# Patient Record
Sex: Female | Born: 1970
Health system: Southern US, Community
[De-identification: ages and names within clinical notes are randomized; demographics above are authoritative.]

## PROBLEM LIST (undated history)

## (undated) DIAGNOSIS — I10 Essential (primary) hypertension: Secondary | ICD-10-CM

## (undated) DIAGNOSIS — R519 Headache, unspecified: Secondary | ICD-10-CM

## (undated) DIAGNOSIS — R51 Headache: Secondary | ICD-10-CM

## (undated) HISTORY — DX: Essential (primary) hypertension: I10

---

## 1998-10-22 ENCOUNTER — Emergency Department (HOSPITAL_COMMUNITY): Admission: EM | Admit: 1998-10-22 | Discharge: 1998-10-22 | Payer: Self-pay | Admitting: Emergency Medicine

## 2001-05-09 ENCOUNTER — Emergency Department (HOSPITAL_COMMUNITY): Admission: EM | Admit: 2001-05-09 | Discharge: 2001-05-09 | Payer: Self-pay

## 2001-05-19 ENCOUNTER — Emergency Department (HOSPITAL_COMMUNITY): Admission: EM | Admit: 2001-05-19 | Discharge: 2001-05-19 | Payer: Self-pay | Admitting: Emergency Medicine

## 2001-07-27 ENCOUNTER — Other Ambulatory Visit: Admission: RE | Admit: 2001-07-27 | Discharge: 2001-07-27 | Payer: Self-pay | Admitting: Gynecology

## 2001-10-06 HISTORY — PX: MYOMECTOMY: SHX85

## 2001-10-06 HISTORY — PX: OVARIAN CYST REMOVAL: SHX89

## 2001-12-27 ENCOUNTER — Inpatient Hospital Stay (HOSPITAL_COMMUNITY): Admission: AD | Admit: 2001-12-27 | Discharge: 2001-12-29 | Payer: Self-pay | Admitting: Gynecology

## 2001-12-27 ENCOUNTER — Encounter (INDEPENDENT_AMBULATORY_CARE_PROVIDER_SITE_OTHER): Payer: Self-pay

## 2001-12-28 ENCOUNTER — Encounter: Payer: Self-pay | Admitting: Gynecology

## 2002-02-07 ENCOUNTER — Other Ambulatory Visit: Admission: RE | Admit: 2002-02-07 | Discharge: 2002-02-07 | Payer: Self-pay | Admitting: Gynecology

## 2002-05-16 ENCOUNTER — Ambulatory Visit (HOSPITAL_COMMUNITY): Admission: RE | Admit: 2002-05-16 | Discharge: 2002-05-16 | Payer: Self-pay | Admitting: Gynecology

## 2002-05-16 ENCOUNTER — Encounter: Payer: Self-pay | Admitting: Gynecology

## 2002-06-07 ENCOUNTER — Inpatient Hospital Stay (HOSPITAL_COMMUNITY): Admission: RE | Admit: 2002-06-07 | Discharge: 2002-06-10 | Payer: Self-pay | Admitting: Gynecology

## 2002-06-07 ENCOUNTER — Encounter (INDEPENDENT_AMBULATORY_CARE_PROVIDER_SITE_OTHER): Payer: Self-pay

## 2004-07-24 ENCOUNTER — Emergency Department (HOSPITAL_COMMUNITY): Admission: EM | Admit: 2004-07-24 | Discharge: 2004-07-24 | Payer: Self-pay | Admitting: Emergency Medicine

## 2004-10-22 ENCOUNTER — Emergency Department (HOSPITAL_COMMUNITY): Admission: EM | Admit: 2004-10-22 | Discharge: 2004-10-22 | Payer: Self-pay | Admitting: Emergency Medicine

## 2008-06-08 ENCOUNTER — Encounter: Admission: RE | Admit: 2008-06-08 | Discharge: 2008-06-08 | Payer: Self-pay | Admitting: Family Medicine

## 2008-06-14 ENCOUNTER — Encounter: Admission: RE | Admit: 2008-06-14 | Discharge: 2008-06-14 | Payer: Self-pay | Admitting: Family Medicine

## 2008-12-15 ENCOUNTER — Encounter: Admission: RE | Admit: 2008-12-15 | Discharge: 2008-12-15 | Payer: Self-pay | Admitting: Family Medicine

## 2009-01-02 ENCOUNTER — Emergency Department (HOSPITAL_COMMUNITY): Admission: EM | Admit: 2009-01-02 | Discharge: 2009-01-02 | Payer: Self-pay | Admitting: Emergency Medicine

## 2009-06-18 ENCOUNTER — Encounter: Admission: RE | Admit: 2009-06-18 | Discharge: 2009-06-18 | Payer: Self-pay | Admitting: Family Medicine

## 2009-06-19 HISTORY — PX: BREAST BIOPSY: SHX20

## 2010-04-06 ENCOUNTER — Ambulatory Visit (HOSPITAL_COMMUNITY): Admission: RE | Admit: 2010-04-06 | Discharge: 2010-04-06 | Payer: Self-pay | Admitting: Family Medicine

## 2011-01-20 ENCOUNTER — Emergency Department (HOSPITAL_COMMUNITY)
Admission: EM | Admit: 2011-01-20 | Discharge: 2011-01-20 | Disposition: A | Discharge status: Home / Self Care | Payer: No Typology Code available for payment source | Attending: Emergency Medicine | Admitting: Emergency Medicine

## 2011-01-20 DIAGNOSIS — I10 Essential (primary) hypertension: Secondary | ICD-10-CM | POA: Insufficient documentation

## 2011-01-20 DIAGNOSIS — Y92410 Unspecified street and highway as the place of occurrence of the external cause: Secondary | ICD-10-CM | POA: Insufficient documentation

## 2011-01-20 DIAGNOSIS — M549 Dorsalgia, unspecified: Secondary | ICD-10-CM | POA: Insufficient documentation

## 2011-01-20 DIAGNOSIS — T1490XA Injury, unspecified, initial encounter: Secondary | ICD-10-CM | POA: Insufficient documentation

## 2011-02-21 NOTE — H&P (Signed)
NAMESHEYENNE, KONZ                        ACCOUNT NO.:  [id number]   MEDICAL RECORD NO.:  [id number]                    PATIENT TYPE:   LOCATION:                                       FACILITY:  [facility]   PHYSICIAN:  Juan H. Lily Peer, M.D.             DATE OF BIRTH:   DATE OF ADMISSION:  06/07/2002  DATE OF DISCHARGE:                                HISTORY & PHYSICAL   CHIEF COMPLAINT:  The patient is a 40 year old gravida 2, para 2, who is  being admitted to [facility] for a scheduled exploratory  laparotomy secondary to a pelvic mass that was diagnosed on November 22nd as  part of her OB screening ultrasound at 20 weeks' gestation last year.  It  appears that the cystic mass was difficult to delineate whether it was  coming off the kidney or the uterus in the left adnexal region.  The patient  was continued to follow during her pregnancy and eventually had a  consultation with Dr. Molli Hazard B. Daphine Deutscher back in December of last year and it  was thought that to wait until after the pregnancy for further intervention  of the mass, if the mass were still present.  In July of this year, she had  a CT of the abdomen due to the fact that her ultrasound postpartum on May  5th had demonstrated that there was a questionable peritoneal inclusion cyst  which was echo-free in appearance; this measured 15.3 x 8.9 x 14.0 cm, but  the right and left ovaries were completely reported to be seen.  Also, she  had several fibroids, the posterior fibroid measuring 5.1 x 4.4 x 4.9 cm.  When she ultimately underwent a CT of the abdomen and pelvis, there was  noted again a large well-circumscribed water-density mass arising out of the  pelvis which measured 9.6 x 14.2 x 17.7 cm in size.  There was a second area  of low density noted in the midline which may lie within the uterus; that  area measured 3 x 4 cm in size, likely consistent with a fibroid.  No free  fluid in the cul-de-sac was noted.   There was no evidence of abdominal or  retroperitoneal mass or adenopathy or aneurysm reported.  The bladder  appeared to be normal.  The working diagnoses of possibility of an ovarian  cyst, endometrioma, cystadenoma, cystadenocarcinoma were also in the  differential diagnoses as well as mesenteric duplication cyst or colonic  duplication cyst.  The patient has had tumor marker consisting of CA125,  alpha-fetoprotein and beta hCG, which all were normal.  A recent Pap smear  also was normal as well.  The patient has otherwise been asymptomatic from  this.  She has had a normal chest x-ray as well.  Consultation with Dr. Rolan Bucco had been made, which he will be on standby for exploration in the  event this  may turn out to be non-gynecologically related, and also the GYN  oncologist, Dr. Rande Brunt. Clarke-Pearson, who had been contacted with this  case last year, will be on standby as well, and all of this had been  discussed with the patient.   PAST MEDICAL HISTORY:  She has a history of chronic hypertension for which  she takes Norvasc 10 mg q.d.   FAMILY HISTORY:  An aunt with diabetes and a father with hypertension and  distant family member with history of breast cancer.   ALLERGIES:  She denies any allergies.   OBSTETRICAL HISTORY:  Her children were delivered vaginally.   PHYSICAL EXAMINATION:  GENERAL:  The patient weight 221 pounds, 5 feet 7  inches.  HEENT:  Unremarkable.  NECK:  Supple.  Trachea midline.  No carotid bruits.  No thyromegaly.  LUNGS:  Lungs are clear to auscultation without rhonchi or wheezes.  HEART:  Regular rate and rhythm.  No murmurs or gallops.  BREASTS:  Exam done during the postpartum period was reported to be normal.  ABDOMEN:  Soft, nontender, without rebound or guarding.  PELVIC:  Fullness in the left adnexal region.  RECTAL:  Exam confirmatory.   ASSESSMENT:  [age]-year-old gravida 2, para 2, with pelvic mass present  since November  22nd, diagnosed at time of her prenatal ultrasound, has  remained stable.  The mass appears to be cystic in nature with no evidence  of calcifications or septations, has remained unchanged since that time, as  confirmed by MRI and CT scan, no evidence of lymphadenopathy on either of  these studies, tumor markers being normal.  Working diagnosis is a  cystadenoma; endometrioma or the possibility of a mesenteric duplication  cyst or colonic duplication cyst and obviously, cystadenocarcinoma are the  working diagnoses.  The patient will be undergoing a bowel prep the day  before surgery.  She will receive prophylactic antibiotic during her surgery  and will have also pneumatic compression stockings to prevent deep venous  thrombosis.  We will proceed with a midline incision.  General surgeon, Dr.  Rolan Bucco, will be on standby in the event this is non-gynecologically  related, and also Dr. Reuel Boom L. Clarke-Pearson, gynecological oncologist,  will be on standby as well in the event this is a malignant process.  The  patient has been counseled for the following:  Exploratory laparotomy, left  and/or right cystectomy and/or salpingo-oophorectomy, possibility of a  staging procedure to include partial omentectomy, para-aortic and pelvic  lymphadenectomy with pelvic washings, with complete total abdominal  hysterectomy and bilateral salpingo-oophorectomy in the event of malignancy.  All of this was discussed with the patient and her mother, who was present  in the preoperative consultation in the office recently.  She is also aware  that in the event of uncontrollable hemorrhage, that she will need a  transfusion and there are the risks of anaphylactic reaction, hepatitis and  acquired immunodeficiency syndrome.  All efforts will be made to proceed  with fertility conservation but in the event of malignancy, she is fully aware of that she may lose all her reproductive organs and will no longer be   able to have any children.  Dr. Rolan Bucco had spoken with the patient, in  the event this is malignancy in her colon, she could potentially end up with  a segmental resection, a diverting colostomy from her surgery as well.  Also, potential risks and trauma to internal organs were also discussed as  well.  All the above issues were discussed with the patient and she is also  aware of the risks for a deep venous thrombosis and pulmonary embolism and  the anesthesia team will be discussing with her the potential risks involved  from general anesthetic.  All the above were discussed with the patient and  her mother, all questions were answered and agreeable to proceed with the  above-mentioned procedures as indicated.   PLAN:  1. As per assessment above.  2. Please have history and physical at [facility] in the operating     room for tomorrow's scheduled case posted for 1 p.m.                                               Juan H. Lily Peer, M.D.    JHF/MEDQ  D:  06/06/2002  T:  06/07/2002  Job:  [id number]

## 2011-02-21 NOTE — Discharge Summary (Signed)
[facility]  Patient:    Kelly Hickman, Kelly Hickman Visit Number: [id number] MRN: [id number]          Service Type: OBS Location: [id number] [id number] Attending Physician:  Merrily Pew Dictated by:   Antony Contras, [facility] Admit Date:  12/27/2001 Discharge Date: 12/29/2001                             Discharge Summary  DISCHARGE DIAGNOSES:          1. Intrauterine pregnancy at 37 weeks.                               2. Pregnancy-induced hypertension.                               3. Pelvic mass.  PROCEDURES:                   Vacuum-assisted vaginal delivery of viable infant over a midline episiotomy second degree laceration.  HISTORY OF PRESENT ILLNESS:   The patient is a 40 year old, gravida 3, para 1-0-0-1, LMP April 08, 2001, [facility] January 14, 2002.  Prenatal course was complicated by late prenatal care and also by a 12 x 14 cm echogenic pelvic mass which was diagnosed at the time of her ultrasound.  LABORATORY DATA:              Blood type A positive.  Antibody screen negative.  Sickle cell positive.  RPR, HBsAg, HIV nonreactive.  HOSPITAL COURSE:              The patient was admitted at 37 weeks with pregnancy-induced hypertension and symptomatic with elevated blood pressure and proteinuria.  Induction of labor was initiated.  Cervix was 3 cm, -3 station, vertex.  The patient did progress to complete dilatation and delivered per vacuum assistance an Apgar 8/9 female infant, birth weight 6 pounds 15 ounces over midline episiotomy with second degree laceration.  Due to blood pressure elevation, the patient was placed on magnesium sulfate during labor.  Postpartum, she remained afebrile.  Blood pressure in the range of 140/80 to 160/93.  She was able to be discharged in satisfactory condition on the second postpartum day.  CBC showed hematocrit 30.3, hemoglobin 9.8, WBC 10.3, platelets 203.  DISPOSITION:                  Followup in two weeks for  blood pressure check; continue with prenatal vitamins and iron with Tylox for pain.  The patient was also scheduled to see a general surgeon, Dr. Wenda Low, in four weeks for abdominal cystic mass.  Ultrasound performed in the hospital after delivery did show the mass was stable in size. Dictated by:   Antony Contras, [facility] Attending Physician:  Merrily Pew DD:  01/17/02 TD:  01/17/02 Job: [id number]

## 2011-02-21 NOTE — Op Note (Signed)
Kelly Hickman, Kelly Hickman                     ACCOUNT NO.:  [id number]   MEDICAL RECORD NO.:  [id number]                   PATIENT TYPE:  INP   LOCATION:  X007                                 FACILITY:  [facility]   PHYSICIAN:  Juan H. Lily Peer, M.D.             DATE OF BIRTH:  1970-11-08   DATE OF PROCEDURE:  06/07/2002  DATE OF DISCHARGE:                                 OPERATIVE REPORT   SURGEON:  Juan H. Lily Peer, M.D.   FIRST ASSISTANT:  Devin M. Ciliberti, M.D.   INDICATION FOR OPERATION:  A 40 year old, gravida 2, para 2, with pelvic  mass as well as leiomyomatous uteri.  The patient with normal tumor markers,  normal Pap smear, normal chest x-ray, and CT and MRI with no evidence of any  pelvic or paraaortic lymphadenopathy.   PREOPERATIVE DIAGNOSES:  1. Pelvic mass.  2. Leiomyomatous uteri.   POSTOPERATIVE DIAGNOSES:  1. Pelvic mass.  2. Leiomyomatous uteri.   ANESTHESIA:  General endotracheal.   PROCEDURES PERFORMED:  1. Exploratory laparotomy.  2. Left salpingo-oophorectomy.  3. Myomectomy.  4. Chromopertubation.   FINDINGS:  1. A 12 x 14 cm, smooth left ovarian cyst.  2. A 4 cm intramural leiomyomata.  3. Normal-appearing right ovary and tube.  4. Left fallopian tube stretched and wrapped around the 12 x 14 cm mass.   DESCRIPTION OF OPERATION:  After the patient was adequately counseled, she  was taken to the operating room where she underwent a successful general  endotracheal anesthesia.  When pneumatic compression stockings were in  place, she received 2 g of Cefotan IV.  After the abdomen was prepped and  draped in the usual sterile fashion, a midline incision was made from the  infraumbilical region 1 cm above the symphysis pubis.  Dissection was  carried down through the skin and subcutaneous tissue down to the rectus  fascia whereby a midline nick was made.  The fascia was incised in a  cephalic and caudal fashion.  The O'Sullivan-O'Connor  retractors were in  place.  Pelvic washings were obtained.  At this time, the mass was evident  to be sitting in the interior portion of the pelvis and abdomen and was  brought out of the operative field without rupturing.  Identification  demonstrated that the mass was ovarian in origin, and the rest of the ovary  looked good, but it was attached to it.  With Kelly clamps, the ovarian cyst  was removed.  The left tube was stretched significantly and distorting the  entire left fallopian tube.  The Kelly clamp was placed close to the ovarian  cortex, and the ovarian cyst was removed in toto and submitted for frozen  specimen.  The pathologist called back a few minutes later, stating that  this frozen specimen was benign and probably a benign serous adenoma.  The  ovarian cortex was reapproximated with a running suture of 3-0 Vicryl suture  as  well as some of the mesosalpinx as well.  Attention was then placed to  the fundal aspect of the uterus which had a 4 cm fibroid.  Pitressin 1:5  solution was infiltrated into the serosal surface of the uterus and  following this, a small vertical incision was made in the fundal region of  the uterus, and the 4 cm leiomyoma was enucleated and passed off the  operative field.  The main chamber was reapproximated with a running stitch  of 3-0 Vicryl suture in layers up through the serosa which was also closed  in a running stitch fashion.  Intercede was then placed for hemostasis.  After completion, after the pelvic cavity had been copiously irrigated with  normal saline solution, the pelvic cavity was evaluated, and hemostasis was  evident.  The contralateral tube and ovary were otherwise normal, and no  other abnormality was noted.  Of note, chromopertubation was attempted which  was evident that extravasation of the methylene blue dye did pass through  the right fallopian tube.  Upon completion of the operation, the pediatric  Foley catheter that had  previously been inserted inside the uterus for the  chromopertubation instillation was removed.  Foley catheter was left in  place.  The patient had pneumatic compression stockings.  She was extubated,  transferred to the recovery room.  She received 30 mg of Toradol en route to  recovery room.  Blood loss from the procedure was 75-100 cc.  Urine output  was 150 cc.  IV fluid was 2100 cc of lactated Ringer's.                                                  Juan H. Lily Peer, M.D.    JHF/MEDQ  D:  06/07/2002  T:  06/07/2002  Job:  [id number]

## 2011-02-21 NOTE — Discharge Summary (Signed)
NAMEDETRIA, CUMMINGS                        ACCOUNT NO.:  [id number]   MEDICAL RECORD NO.:  [id number]                   PATIENT TYPE:   LOCATION:                                       FACILITY:   PHYSICIAN:  Juan H. Lily Peer, M.D.             DATE OF BIRTH:   DATE OF ADMISSION:  06/07/2002  DATE OF DISCHARGE:  06/10/2002                                 DISCHARGE SUMMARY   HISTORY OF PRESENT ILLNESS:  The patient is a 40 year old gravida 2, para 2,  who was admitted to [facility] on June 07, 2002, where she  underwent an exploratory laparotomy with left ovarian cystectomy and  salpingectomy along with myomectomy and chromopertubation.  Findings at the  time of surgery demonstrated a 12 x 14 cm smooth left ovarian cyst and a 4  cm intramural fundal leiomyoma.  The right ovary and tube were otherwise  normal.  Intraoperatively she lost approximately less than 100 cc and  received 2100 cc of lactated Ringer's, and urine output was 150 cc.  Her  postoperative day her t-max had been 100.5 and then defervesced to 100.2 and  gradually down to normal temperature.  She had received two additional doses  of Cefotan besides the one she received preoperatively, 1 g q.12h.  Her  urine output was good.  Her hemoglobin and hematocrit were 10.4 and 31.3  respectively, platelet count 199,000.  Blood pressure was fine.  Her Foley  was discontinued, along with the PCA pump, and she was started on a clear  liquid diet and was started to ambulate and placed on Percocet oral  analgesic.  Later that evening on rounds she still did not pass any flatus  but had good bowel sounds and was tolerating her clear liquid diet well, and  she was afebrile.  The following day she was started on a regular diet, had  good bowel sounds, incision site was intact.  She was discharged home on her  third postoperative day whereby she was passing flatus, tolerating a regular  diet, ambulating, and voiding  well.  Final diagnosis on the pathology report  had demonstrated benign ovarian cystadenoma and fallopian tube segment with  fibrous adhesion and a fibroid submitted separately demonstrating pelvic  necrotic tumor consistent with infarcted leiomyoma.  Her pelvic washings  showed no evidence of malignant cells.   FINAL DISPOSITION AND FOLLOW-UP:  The patient was discharged home on her  third postoperative day, ambulating, voiding well, tolerating a regular diet  well.  She was given a prescription for Lortab 7.5/500 to take 1 p.o. q.4-  6h. p.r.n.  For her blood pressure she was instructed to continue her  previously prescribed Norvasc which consists of 10 mg p.o. q.d.  The patient  was to follow up in the office on June 13, 2002, to have her staples  removed at that point.  Discharge instructions were provided.  Juan H. Lily Peer, M.D.    JHF/MEDQ  D:  07/26/2002  T:  07/26/2002  Job:  [id number]

## 2012-04-01 ENCOUNTER — Other Ambulatory Visit: Payer: Self-pay | Admitting: Physician Assistant

## 2012-07-29 ENCOUNTER — Other Ambulatory Visit: Payer: Self-pay | Admitting: Physician Assistant

## 2012-07-29 NOTE — Telephone Encounter (Signed)
Chart pulled to PA pool at nurses station [phone]

## 2012-07-29 NOTE — Telephone Encounter (Signed)
Please pull paper chart.  

## 2012-07-29 NOTE — Telephone Encounter (Signed)
Please advise patient that she needs an appointment.

## 2012-09-06 ENCOUNTER — Ambulatory Visit (INDEPENDENT_AMBULATORY_CARE_PROVIDER_SITE_OTHER): Payer: BC Managed Care – PPO | Admitting: Internal Medicine

## 2012-09-06 VITALS — BP 130/84 | HR 78 | Temp 98.4°F | Resp 16 | Ht 67.0 in | Wt 255.0 lb

## 2012-09-06 DIAGNOSIS — L509 Urticaria, unspecified: Secondary | ICD-10-CM

## 2012-09-06 DIAGNOSIS — L299 Pruritus, unspecified: Secondary | ICD-10-CM

## 2012-09-06 DIAGNOSIS — K1321 Leukoplakia of oral mucosa, including tongue: Secondary | ICD-10-CM

## 2012-09-06 DIAGNOSIS — K1329 Other disturbances of oral epithelium, including tongue: Secondary | ICD-10-CM

## 2012-09-06 DIAGNOSIS — I10 Essential (primary) hypertension: Secondary | ICD-10-CM

## 2012-09-06 LAB — POCT SKIN KOH: Skin KOH, POC: NEGATIVE

## 2012-09-06 MED ORDER — HYDROCHLOROTHIAZIDE 12.5 MG PO CAPS: 12.5000 mg | ORAL_CAPSULE | Freq: Every day | ORAL | Status: DC

## 2012-09-06 MED ORDER — HYDROXYZINE HCL 25 MG PO TABS: 25.0000 mg | ORAL_TABLET | Freq: Three times a day (TID) | ORAL | Status: DC | PRN

## 2012-09-06 MED ORDER — AMLODIPINE BESYLATE 10 MG PO TABS: 10.0000 mg | ORAL_TABLET | Freq: Every day | ORAL | Status: DC

## 2012-09-06 MED ORDER — METHYLPREDNISOLONE SODIUM SUCC 125 MG IJ SOLR
125.0000 mg | Freq: Once | INTRAMUSCULAR | Status: AC
  Administered 2012-09-06: 125 mg via INTRAMUSCULAR

## 2012-09-06 MED ORDER — CETIRIZINE HCL 10 MG PO TABS: 10.0000 mg | ORAL_TABLET | Freq: Every day | ORAL | Status: DC

## 2012-09-06 MED ORDER — PREDNISONE 10 MG PO TABS: ORAL_TABLET | ORAL | Status: DC

## 2012-09-06 MED ORDER — RANITIDINE HCL 300 MG PO TABS: 300.0000 mg | ORAL_TABLET | Freq: Every day | ORAL | Status: DC

## 2012-09-06 NOTE — Progress Notes (Signed)
[address], [place] [address]   Phone [phone]  Subjective:    Patient ID: Kelly Hickman, female    DOB: 08-21-1971, 41 y.o.   MRN: [id number]  HPI  Pt presents to clinic because her face is swollen and very itchy.  She woke up yesterday and R side of her face was swollen and she tried to eat and had a hard time swallowing because her throat felt swollen.  Since then the swelling has spread to the R side of the face also, but the throat feels less swollen.  She has no pain, or sore throat.  She is terribly itchy and could not sleep last night due to itching.  She has tried no OTC meds.  She has changed nothing in her diet, no supplements, no makeup, soaps or detergent changes.  No new exposures to environment that she knows of.    She also needs refills on her BP meds.  She did not eat great over the holidays.  She has been taking her medications daily.  Her BP at home between the 120-130, unsure of the bottom number.     Review of Systems  Constitutional: Negative for fever and chills.  HENT: Positive for trouble swallowing (due to swelling). Negative for congestion and sore throat.   Respiratory: Negative for shortness of breath and wheezing.   Cardiovascular: Negative for chest pain.  Gastrointestinal: Negative for nausea and abdominal pain.  Skin: Positive for rash.       Objective:   Physical Exam  Vitals reviewed. Constitutional: She is oriented to person, place, and time. She appears well-developed and well-nourished.  HENT:  Head: Normocephalic and atraumatic.  Mouth/Throat: Uvula is midline. No oral lesions. No uvula swelling. No oropharyngeal exudate, posterior oropharyngeal edema, posterior oropharyngeal erythema or tonsillar abscesses.       Hives on auricle and at opening of external canal of ear. Petechia on roof of mouth (pt using tongue depressor for scratching.) - no oral lesions. White coloration on tongue mainly on the L (KOH scraping done)    Cardiovascular: Normal rate, regular rhythm and normal heart sounds.   Pulmonary/Chest: Effort normal and breath sounds normal.  Neurological: She is alert and oriented to person, place, and time.  Skin: Rash noted.       Entire face has hives and dermatographism.  No rash on neck or any other part of her body.    Psychiatric: She has a normal mood and affect. Her behavior is normal. Judgment and thought content normal.   Results for orders placed in visit on 09/06/12  POCT SKIN KOH      Component Value Range   Skin KOH, POC Negative            Assessment & Plan:   1. Hives  methylPREDNISolone sodium succinate (SOLU-MEDROL) 125 mg/2 mL injection 125 mg, predniSONE (DELTASONE) 10 MG tablet  2. HTN (hypertension)  hydrochlorothiazide (MICROZIDE) 12.5 MG capsule, amLODipine (NORVASC) 10 MG tablet  3. Tongue plaque  POCT Skin KOH  4. Itching  cetirizine (ZYRTEC) 10 MG tablet, ranitidine (ZANTAC) 300 MG tablet, hydrOXYzine (ATARAX/VISTARIL) 25 MG tablet   1- unsure of cause but pt is definitely having an allergic reaction to something.  It may be idiopathic in origin.  If it does not improve for returns in the future will send to allergist. 2- needs med refills and will do but we have to see patient back in 1 month to do labs and recheck due to  elevated reading today, but pt is uncomfortable and did not sleep last pm. 3- neg koh 4- related to #1  Pt will recheck if no better in 48h sooner is worse and if any difficulty breathing to ED.  Pt aggress and understands.  D/w and seen with Dr. Merla Riches. I have reviewed and agree with documentation. Robert P. Merla Riches, M.D.

## 2012-11-06 ENCOUNTER — Other Ambulatory Visit: Payer: Self-pay | Admitting: Physician Assistant

## 2014-02-21 ENCOUNTER — Emergency Department (HOSPITAL_COMMUNITY): Payer: BC Managed Care – PPO

## 2014-02-21 ENCOUNTER — Emergency Department (HOSPITAL_COMMUNITY)
Admission: EM | Admit: 2014-02-21 | Discharge: 2014-02-21 | Disposition: A | Discharge status: Home / Self Care | Payer: BC Managed Care – PPO | Attending: Emergency Medicine | Admitting: Emergency Medicine

## 2014-02-21 ENCOUNTER — Encounter (HOSPITAL_COMMUNITY): Payer: Self-pay | Admitting: Emergency Medicine

## 2014-02-21 DIAGNOSIS — I10 Essential (primary) hypertension: Secondary | ICD-10-CM | POA: Insufficient documentation

## 2014-02-21 DIAGNOSIS — R42 Dizziness and giddiness: Secondary | ICD-10-CM | POA: Insufficient documentation

## 2014-02-21 DIAGNOSIS — Z3202 Encounter for pregnancy test, result negative: Secondary | ICD-10-CM | POA: Insufficient documentation

## 2014-02-21 DIAGNOSIS — R0789 Other chest pain: Secondary | ICD-10-CM

## 2014-02-21 DIAGNOSIS — R071 Chest pain on breathing: Secondary | ICD-10-CM | POA: Insufficient documentation

## 2014-02-21 HISTORY — DX: Headache, unspecified: R51.9

## 2014-02-21 HISTORY — DX: Headache: R51

## 2014-02-21 LAB — URINALYSIS, ROUTINE W REFLEX MICROSCOPIC
Bilirubin Urine: NEGATIVE
Glucose, UA: NEGATIVE mg/dL
Ketones, ur: NEGATIVE mg/dL
Leukocytes, UA: NEGATIVE
NITRITE: NEGATIVE
Protein, ur: NEGATIVE mg/dL
Specific Gravity, Urine: 1.024 (ref 1.005–1.030)
UROBILINOGEN UA: 0.2 mg/dL (ref 0.0–1.0)
pH: 5 (ref 5.0–8.0)

## 2014-02-21 LAB — BASIC METABOLIC PANEL
BUN: 12 mg/dL (ref 6–23)
CALCIUM: 9.1 mg/dL (ref 8.4–10.5)
CO2: 25 mEq/L (ref 19–32)
Chloride: 104 mEq/L (ref 96–112)
Creatinine, Ser: 0.9 mg/dL (ref 0.50–1.10)
GFR calc Af Amer: 89 mL/min — ABNORMAL LOW (ref >90)
GFR calc non Af Amer: 77 mL/min — ABNORMAL LOW (ref >90)
GLUCOSE: 88 mg/dL (ref 70–99)
Potassium: 4.5 mEq/L (ref 3.7–5.3)
SODIUM: 140 meq/L (ref 137–147)

## 2014-02-21 LAB — I-STAT TROPONIN, ED: Troponin i, poc: 0 ng/mL (ref 0.00–0.08)

## 2014-02-21 LAB — PREGNANCY, URINE: PREG TEST UR: NEGATIVE

## 2014-02-21 LAB — CBC
HCT: 32 % — ABNORMAL LOW (ref 36.0–46.0)
Hemoglobin: 9.5 g/dL — ABNORMAL LOW (ref 12.0–15.0)
MCH: 19.3 pg — AB (ref 26.0–34.0)
MCHC: 29.7 g/dL — AB (ref 30.0–36.0)
MCV: 65 fL — ABNORMAL LOW (ref 78.0–100.0)
PLATELETS: 313 10*3/uL (ref 150–400)
RBC: 4.92 MIL/uL (ref 3.87–5.11)
RDW: 18.4 % — AB (ref 11.5–15.5)
WBC: 5.9 10*3/uL (ref 4.0–10.5)

## 2014-02-21 LAB — URINE MICROSCOPIC-ADD ON

## 2014-02-21 MED ORDER — ACETAMINOPHEN 325 MG PO TABS
650.0000 mg | ORAL_TABLET | Freq: Once | ORAL | Status: AC
  Administered 2014-02-21: 650 mg via ORAL
  Filled 2014-02-21: qty 2

## 2014-02-21 MED ORDER — IBUPROFEN 400 MG PO TABS
400.0000 mg | ORAL_TABLET | Freq: Once | ORAL | Status: AC
  Administered 2014-02-21: 400 mg via ORAL
  Filled 2014-02-21: qty 1

## 2014-02-21 NOTE — ED Notes (Signed)
She states she didn't feel well when she woke up this morning. After she got to work she began to have CP and felt lightheaded. She went to see the nurse and her BP was 180/110 so the nurse told her to come to the ED. She states she had a brief episode of R arm numbness this morning also, but that has resolved. Her speech is slow but clear, her son states "she always talks like that." she is alert and breathing easily

## 2014-02-21 NOTE — ED Provider Notes (Signed)
CSN: [id number]     Arrival date & time 02/21/14  1056 History   First MD Initiated Contact with Patient 02/21/14 1159     Chief Complaint  Patient presents with  . Chest Pain    HPI Pt was seen at 1220. Per pt, c/o gradual onset and persistence of constant chest "pain" since waking up at 0300. States she has been "under a lot of stress" and "just not feeling right" when she woke up at 0300. Cannot be more specific. Pt states while she was at work she "got into a situation" with her other co-workers and "got more upset." States she "jumped up quick" from sit to standing and "felt lightheaded" and "like everything was spinning." Pt states she sat down again with improvement. At one point pt also experienced "numbness" in the dorsal aspect of her right forearm that lasted only a few seconds and has not returned; denies focal weakness. Pt states she "was still upset about the situation at work" so she went to the nurse and had her BP checked. The nurse told her that her BP was "180/100" and she was sent home. Pt states her family "made me come to the ER" because she was still having chest discomfort. Family states pt is otherwise acting per her baseline. Denies palpitations, no SOB/cough, no abd pain, N/V/D, no back pain. Per pt, c/o gradual onset and persistence of constant acute flair of her chronic migraine headache "since all this happened today." Describes the headache as "dull" and "aching," per her usual chronic headache pain pattern for several years.  Denies headache was sudden or maximal in onset or at any time.  Denies visual changes, no slurred speech, no focal motor weakness, no fevers, no neck pain, no rash.      Past Medical History  Diagnosis Date  . Hypertension   . Headache    Past Surgical History  Procedure Laterality Date  . Ovarian cyst removal Left 2003  . Myomectomy  2003   Family History  Problem Relation Age of Onset  . Adopted: Yes   History  Substance Use Topics   . Smoking status: Never Smoker   . Smokeless tobacco: Not on file  . Alcohol Use: No    Review of Systems ROS: Statement: All systems negative except as marked or noted in the HPI; Constitutional: Negative for fever and chills. ; ; Eyes: Negative for eye pain, redness and discharge. ; ; ENMT: Negative for ear pain, hoarseness, nasal congestion, sinus pressure and sore throat. ; ; Cardiovascular: +CP. Negative for palpitations, diaphoresis, dyspnea and peripheral edema. ; ; Respiratory: Negative for cough, wheezing and stridor. ; ; Gastrointestinal: Negative for nausea, vomiting, diarrhea, abdominal pain, blood in stool, hematemesis, jaundice and rectal bleeding. . ; ; Genitourinary: Negative for dysuria, flank pain and hematuria. ; ; Musculoskeletal: Negative for back pain and neck pain. Negative for swelling and trauma.; ; Skin: Negative for pruritus, rash, abrasions, blisters, bruising and skin lesion.; ; Neuro: +"spinning," paresthesia. Negative for headache, lightheadedness and neck stiffness. Negative for weakness, altered level of consciousness , altered mental status, extremity weakness, involuntary movement, seizure and syncope.; Psych:  +"stressed out."       Allergies  Review of patient's allergies indicates no known allergies.  Home Medications   Prior to Admission medications   Medication Sig Start Date End Date Taking? Authorizing Provider  ASPIRIN PO Take 1 tablet by mouth daily as needed (pain).   Yes Historical Provider, MD   BP  162/86  Pulse 60  Temp(Src) 98.2 F (36.8 C) (Oral)  Resp 21  Ht 5\' 7"  (1.702 m)  Wt 239 lb (108.41 kg)  BMI 37.42 kg/m2  SpO2 99% Physical Exam 1225: Physical examination:  Nursing notes reviewed; Vital signs and O2 SAT reviewed;  Constitutional: Well developed, Well nourished, Well hydrated, In no acute distress; Head:  Normocephalic, atraumatic; Eyes: EOMI, PERRL, No scleral icterus; ENMT: Mouth and pharynx normal, Mucous membranes moist;  Neck: Supple, Full range of motion, No lymphadenopathy; Cardiovascular: Regular rate and rhythm, No murmur, rub, or gallop; Respiratory: Breath sounds clear & equal bilaterally, No rales, rhonchi, wheezes.  Speaking full sentences with ease, Normal respiratory effort/excursion; Chest: Nontender, Movement normal; Abdomen: Soft, Nontender, Nondistended, Normal bowel sounds; Genitourinary: No CVA tenderness; Spine:  No midline CS, TS, LS tenderness. +TTP left > right hypertonic trapezius muscles.;; Extremities: Pulses normal, No tenderness, No edema, No calf edema or asymmetry.; Neuro: AA&Ox3, Major CN grossly intact. Speech clear.  No facial droop.  No nystagmus. Grips equal. Strength 5/5 equal bilat UE's and LE's.  DTR 2/4 equal bilat UE's and LE's.  No gross sensory deficits.  Normal cerebellar testing bilat UE's (finger-nose) and LE's (heel-shin). Climbs on and off stretcher easily by herself. Gait steady.; Skin: Color normal, Warm, Dry.    ED Course  Procedures     EKG Interpretation   Date/Time:  Tuesday Feb 21 2014 10:59:10 EDT Ventricular Rate:  92 PR Interval:  124 QRS Duration: 86 QT Interval:  368 QTC Calculation: 455 R Axis:   18 Text Interpretation:  Normal sinus rhythm Normal ECG No old tracing to  compare Confirmed by Juleen ChinaKOHUT  MD, STEPHEN ([id number]) on 02/21/2014 11:04:17 AM      MDM  MDM Reviewed: previous chart, nursing note and vitals Interpretation: labs, ECG and x-ray   Results for orders placed during the hospital encounter of 02/21/14  CBC      Result Value Ref Range   WBC 5.9  4.0 - 10.5 K/uL   RBC 4.92  3.87 - 5.11 MIL/uL   Hemoglobin 9.5 (*) 12.0 - 15.0 g/dL   HCT 16.132.0 (*) 09.636.0 - 04.546.0 %   MCV 65.0 (*) 78.0 - 100.0 fL   MCH 19.3 (*) 26.0 - 34.0 pg   MCHC 29.7 (*) 30.0 - 36.0 g/dL   RDW 40.918.4 (*) 81.111.5 - 91.415.5 %   Platelets 313  150 - 400 K/uL  BASIC METABOLIC PANEL      Result Value Ref Range   Sodium 140  137 - 147 mEq/L   Potassium 4.5  3.7 - 5.3 mEq/L    Chloride 104  96 - 112 mEq/L   CO2 25  19 - 32 mEq/L   Glucose, Bld 88  70 - 99 mg/dL   BUN 12  6 - 23 mg/dL   Creatinine, Ser 7.820.90  0.50 - 1.10 mg/dL   Calcium 9.1  8.4 - 95.610.5 mg/dL   GFR calc non Af Amer 77 (*) >90 mL/min   GFR calc Af Amer 89 (*) >90 mL/min  URINALYSIS, ROUTINE W REFLEX MICROSCOPIC      Result Value Ref Range   Color, Urine YELLOW  YELLOW   APPearance HAZY (*) CLEAR   Specific Gravity, Urine 1.024  1.005 - 1.030   pH 5.0  5.0 - 8.0   Glucose, UA NEGATIVE  NEGATIVE mg/dL   Hgb urine dipstick LARGE (*) NEGATIVE   Bilirubin Urine NEGATIVE  NEGATIVE   Ketones, ur NEGATIVE  NEGATIVE mg/dL   Protein, ur NEGATIVE  NEGATIVE mg/dL   Urobilinogen, UA 0.2  0.0 - 1.0 mg/dL   Nitrite NEGATIVE  NEGATIVE   Leukocytes, UA NEGATIVE  NEGATIVE  PREGNANCY, URINE      Result Value Ref Range   Preg Test, Ur NEGATIVE  NEGATIVE  URINE MICROSCOPIC-ADD ON      Result Value Ref Range   Squamous Epithelial / LPF FEW (*) RARE   WBC, UA 0-2  <3 WBC/hpf   RBC / HPF 7-10  <3 RBC/hpf   Bacteria, UA FEW (*) RARE   Urine-Other MUCOUS PRESENT    I-STAT TROPOININ, ED      Result Value Ref Range   Troponin i, poc 0.00  0.00 - 0.08 ng/mL   Comment 3            Dg Chest 2 View 02/21/2014   CLINICAL DATA:  Chest pain and hypertension.  EXAM: CHEST  2 VIEW  COMPARISON:  None.  FINDINGS: The cardiomediastinal silhouette is within normal limits. The lungs are slightly hypoinflated and clear. No pleural effusion or pneumothorax is identified. No acute osseous abnormality is identified.  IMPRESSION: No active cardiopulmonary disease.   Electronically Signed   By: Sebastian AcheAllen  Grady   On: 02/21/2014 12:43    1425:  Pt states she feels better after meds and wants to go home now. Pt is not orthostatic. Neuro exam remains intact and unchanged; doubt CVA. Doubt PE as cause for symptoms with low risk [place].  Doubt ACS as cause for symptoms with normal troponin and EKG after 8+ hours of constant symptoms. Dx and  testing d/w pt.  Questions answered.  Verb understanding, agreeable to d/c home with outpt f/u.       Laray AngerKathleen M Rahiem Schellinger, DO 02/25/14 0001

## 2014-02-21 NOTE — Discharge Instructions (Signed)
°Emergency Department Resource Guide °1) Find a Doctor and Pay Out of Pocket °Although you won't have to find out who is covered by your insurance plan, it is a good idea to ask around and get recommendations. You will then need to call the office and see if the doctor you have chosen will accept you as a new patient and what types of options they offer for patients who are self-pay. Some doctors offer discounts or will set up payment plans for their patients who do not have insurance, but you will need to ask so you aren't surprised when you get to your appointment. ° °2) Contact Your Local Health Department °Not all health departments have doctors that can see patients for sick visits, but many do, so it is worth a call to see if yours does. If you don't know where your local health department is, you can check in your phone book. The CDC also has a tool to help you locate your state's health department, and many state websites also have listings of all of their local health departments. ° °3) Find a Walk-in Clinic °If your illness is not likely to be very severe or complicated, you may want to try a walk in clinic. These are popping up all over the country in pharmacies, drugstores, and shopping centers. They're usually staffed by nurse practitioners or physician assistants that have been trained to treat common illnesses and complaints. They're usually fairly quick and inexpensive. However, if you have serious medical issues or chronic medical problems, these are probably not your best option. ° °No Primary Care Doctor: °- Call [facility] at  832-8000 - they can help you locate a primary care doctor that  accepts your insurance, provides certain services, etc. °- Physician Referral Service- 1-800-533-3463 ° °Chronic Pain Problems: °Organization         Address  Phone   Notes  °[facility]  ([phone]) [phone] Patients need to be referred by their primary care doctor.  ° °Medication  Assistance: °Organization         Address  Phone   Notes  °[place] [facility] [address] °[place], [place] [address] (336) 641-8030 --Must be a resident of [place] °-- Must have NO insurance coverage whatsoever (no Medicaid/ Medicare, etc.) °-- The pt. MUST have a primary care doctor that directs their care regularly and follows them in the community °  °[facility]  (866) 331-1348   °[employer]  (888) 892-1162   ° °Agencies that provide inexpensive medical care: °Organization         Address  Phone   Notes  °[facility]  (336) 832-8035   °[facility]    (336) 832-7272   °[facility] [address] °[place], [place] [address] (336) 832-4777   °[facility] [address], °[place] (336) 271-4999   °[facility]    (336) 373-0678   °[facility]    (336) 272-1050   °[facility] ° [address], [place] Phone:  (336) 832-4444, Fax:  (336) 832-4440 Hours of Operation:  9 am - 6 pm, M-F.  Also accepts Medicaid/Medicare and self-pay.  °[facility] ° [address], [place] Phone: (336) [phone], Fax: (336) [phone]. Hours of Operation:  8:30 am - 5:30 pm, M-F.  Also accepts Medicaid and self-pay.  °[facility] [place] [address], [place] Phone: (336) 878-6027   °[facility] [address], [place], [place] (336)723-1848, Ext. 123 Mondays & Thursdays: 7-9 AM.  First 15 patients are seen on a first come, first serve basis. °  ° °Medicaid-accepting [place] Providers: ° °Organization         Address  Phone   Notes  °[facility] [address], [place] (336) 641-2100 Also accepts self-pay patients.  °[facility] [address], [place] ° (336) 856-9996   °[facility] [address], [place]  (336) 288-8857   °[facility] [address] [place] [address], [place] (336) 299-7000   °Veita Bland [address], [place]  ° (336) 373-1557 Only accepts [place] Access Medicaid patients after they have their name applied to their card.  ° °Self-Pay (no insurance) in [place]: ° °Organization         Address  Phone   Notes  °Sickle Cell Patients, [facility] [address], [place] (336) 832-1970   °[facility] [address], [place] (336) 832-4400   °[facility] ° [address], [place] (336) 992-4800   °[facility]/Dr. Osei-Bonsu ° [address] [place] [address], [place] or [address], [place] (336) [phone] Phone number for both [place] and [place] locations is the same.  °[facility] [address], [place] (336) 299-0000   °[facility] [address] [place] [address], [place] or [address] (336) 852-7530 °(336) 878-2260   °[facility] [address], [place] (336) 350-1642, phone; (336) 294-5005, fax Sees patients 1st and 3rd Saturday of every month.  Must not qualify for public or private insurance (i.e. Medicaid, Medicare, [place] Health Choice, Veterans' Benefits) • Household income should be no more than 200% of the poverty level •The clinic cannot treat you if you are pregnant or think you are pregnant • Sexually transmitted diseases are not treated at the clinic.  ° ° °Dental Care: °Organization         Address  Phone  Notes  °[place] [facility] [address], [place] (336) 641-6152 Accepts children up to age 21 who are enrolled in Medicaid or [place] Health Choice; pregnant women with a Medicaid card; and children who have applied for Medicaid or [place] Health Choice, but were declined, whose parents can pay a reduced fee at time of service.  °[place]  [facility] [place]  [address], [place] (336) 641-7733 Accepts children up to age 21 who are enrolled in Medicaid or [place] Health Choice; pregnant women with a Medicaid card; and children who have applied for Medicaid or [place] Health Choice, but were declined, whose parents can pay a reduced fee at time of service.  °[facility] ° [address], [place] (336) 641-4533 Patients are seen by appointment only. Walk-ins are not accepted. [facility] will see patients 18 years of age and older. °Monday - Tuesday (8am-5pm) °Most Wednesdays (8:30-5pm) °$30 per visit, cash only  °[facility] ° [address], [place] (336) 641-4533 Patients are seen by appointment only. Walk-ins are not accepted. [facility] will see patients 18 years of age and older. °One   Wednesday Evening (Monthly: Volunteer Based).  $30 per visit, cash only  °[facility]  (919) [phone] for adults; Children under age 4, call Graduate Pediatric Dentistry at ([phone]) [phone]. Children aged 4-14, please call ([phone]) [phone] to request a pediatric application. ° Dental services are provided in all areas of dental care including fillings, crowns and bridges, complete and partial dentures, implants, gum treatment, root canals, and extractions. Preventive care is also provided. Treatment is provided to both adults and children. °Patients are selected via a lottery and there is often a waiting list. °  °[facility] [address], °[place] ° (336) 763-8833 [email or web address] °  °[facility] [address], [place], [place] (336)723-1848, Ext. 123 Second and Fourth Thursday of each month, opens at 6:30 AM; Clinic ends at 9 AM.  Patients are seen on a first-come first-served basis, and a limited number are seen during each clinic.  ° °[facility] ° [address], [place], [place] (336) 723-7904    Eligibility Requirements °You must have lived in [place], [place], or [place] counties for at least the last three months. °  You cannot be eligible for state or federal sponsored healthcare insurance, including Veterans Administration, Medicaid, or Medicare. °  You generally cannot be eligible for healthcare insurance through your employer.  °  How to apply: °Eligibility screenings are held every Tuesday and Wednesday afternoon from 1:00 pm until 4:00 pm. You do not need an appointment for the interview!  °[facility] [address], [place], [place] 336-631-2330   °[facility]  336-342-8273   °[facility]  336-703-3100   °[facility]  336-570-6415   ° °Behavioral Health Resources in the Community: °Intensive Outpatient Programs °Organization         Address  Phone  Notes  °[facility] [address], [place], [place] 336-878-6098   °[facility] [address], [place], [place] 336-832-9800   °[facility] [address], [place], [place] ° 336-882-2125   °[facility] [address],  °[place], [place] 1-800-853-5163 or 336-641-4981   °Substance Abuse Resources °Organization         Address  Phone  Notes  °[facility]  336-882-2125   °[facility]  336-784-9470   °[facility]  336-285-9073   °[facility]  336-845-3988   °Residential & Outpatient Substance Abuse Program  1-800-659-3381   °Psychological Services °Organization         Address  Phone  Notes  °[facility]  336- 832-9600   °[facility]  336- 378-7881   °[facility] [address], [place] 1-800-853-5163 or 336-641-4981   ° °Mobile Crisis Teams °Organization         Address  Phone  Notes  °[facility], Mobile Crisis Care Unit  1-877-626-1772   °Assertive °[facility] ° [address]  [place], [place] 336-834-9664   °Sharon DeEsch [address] °[place] 336-554-5454   ° °Self-Help/Support Groups °Organization         Address  Phone             Notes  °[facility] of  - variety of support groups  336- 373-1402 Call for more information  °Narcotics Anonymous (NA), [facility] [address], °[place]  2 meetings at this location  ° °  Residential Treatment Programs Organization         Address  Phone  Notes  [facility] [address],    [place]  [phone]   [facility]  [address], [place] [phone], [place], [place] [phone]   [facility] [address], [place] [phone] Admissions: 8am-3pm M-F  [facility] [address]    [place], [place] 295-284-1324([phone]   [facility] [address], [place], [place] [phone]) [phone]   [facility] [address]  [place], [place] [phone]) [phone]   [facility] Intensive Outpatient [address], [place], [place] [phone]   [facility] ([facility] [address]  [place], [place] [phone] or [phone]   [facility] [address] [place], [place] [phone] Accepts Medicaid  [facility] [address]  [place] [phone] Substance Abuse/Addiction Treatment   [facility]         Address  Phone  Notes  [facility]  (323)[phone](888) (206) [phone]   Angie FavaJulie Brannon, PhD [address], [address] [place], [place]   [phone](336) [phone] or [phone]) [phone]   [facility]   [address] [place], [place] [phone](336) ([phone]   [facility] [address], [place], [place] [phone]) [phone] Insurance/Medicaid/sponsorship through [facility] [address]                                    [place], [place] [phone]) [phone] Therapy/tele-psych/case    [facility] [address]   [place], [place] [phone]) [phone]    Dr. Lolly MustacheArfeen  [phone]) [phone]   [facility]  [employer] [facility] 1) [address], [place] 2) [address], [place] 3)  [address], [place] [phone](336) [phone](336) [phone]  [phone](336) ([phone]   [facility] Child Abuse Hotline 3206941133(336) 279-240-2275 or (820)[phone](336) [phone] (After Hours)       Take your usual prescriptions as previously directed.  Take your blood pressure only ONCE per day, either in the morning after you take your medicine(s) or in the evening before you go to bed.  Always sit quietly for at least 15 minutes before taking your blood pressure.  Keep a diary of your blood pressures to show your doctor at your follow up office visit. Take over the counter tylenol and ibuprofen, as directed on packaging, as needed for discomfort. Call your regular medical doctor today to schedule a follow up appointment within the next 2 days.  Return to the Emergency Department immediately sooner if worsening.

## 2019-04-01 ENCOUNTER — Other Ambulatory Visit: Payer: Self-pay | Admitting: Internal Medicine

## 2019-04-07 LAB — NOVEL CORONAVIRUS, NAA: SARS-CoV-2, NAA: NOT DETECTED

## 2019-10-21 ENCOUNTER — Encounter: Payer: Self-pay | Admitting: Registered Nurse

## 2019-10-21 ENCOUNTER — Ambulatory Visit: Payer: BC Managed Care – PPO | Admitting: Registered Nurse

## 2019-10-21 ENCOUNTER — Other Ambulatory Visit: Payer: Self-pay

## 2019-10-21 VITALS — BP 186/106 | HR 88 | Temp 98.3°F | Ht 67.0 in | Wt 249.2 lb

## 2019-10-21 DIAGNOSIS — Z13220 Encounter for screening for lipoid disorders: Secondary | ICD-10-CM

## 2019-10-21 DIAGNOSIS — Z1329 Encounter for screening for other suspected endocrine disorder: Secondary | ICD-10-CM | POA: Diagnosis not present

## 2019-10-21 DIAGNOSIS — Z23 Encounter for immunization: Secondary | ICD-10-CM

## 2019-10-21 DIAGNOSIS — I10 Essential (primary) hypertension: Secondary | ICD-10-CM

## 2019-10-21 DIAGNOSIS — Z130 Encounter for screening for diseases of the blood and blood-forming organs and certain disorders involving the immune mechanism: Secondary | ICD-10-CM

## 2019-10-21 DIAGNOSIS — Z13228 Encounter for screening for other metabolic disorders: Secondary | ICD-10-CM | POA: Diagnosis not present

## 2019-10-21 MED ORDER — AMLODIPINE BESYLATE 5 MG PO TABS: 5.0000 mg | ORAL_TABLET | Freq: Every day | ORAL | 0 refills | Status: DC

## 2019-10-21 NOTE — Patient Instructions (Addendum)
Ms Kelly Hickman to meet you today.   Your blood pressure is elevated, but we can help. Please take Norvasc (amlodipine) 5mg  by mouth each morning. After two weeks, increase this to 10mg  daily.  I'm going to have you stop by for a nurse only visit in 2 weeks for a blood pressure check. It would be great to have you bring your home blood pressure cuff in with you so we can compare the readings to ours here in the office. I'll have you come in for a visit with me about 2 weeks after that, in mid February. At that time. We'll discuss how your blood pressure looks and take further steps if needed  Today we drew some labs - CBC (blood counts) CMP (liver and kidney function, some electrolytes and minerals) TSH (thyroid function) A1c (sugars) Lipid panel (cholesterol). I'd expect these to be back next week at some point.  Please feel free to reach out with any questions, comments, or concerns. I'd be happy to address any of these for you.  , NP  If you have lab work done today you will be contacted with your lab results within the next 2 weeks.  If you have not heard from March then please contact [facility]. The fastest way to get your results is to register for My Chart.   IF you received an x-ray today, you will receive an invoice from [facility]. Please contact [facility] at [phone] with questions or concerns regarding your invoice.   IF you received labwork today, you will receive an invoice from [place]. Please contact [facility] at [phone] with questions or concerns regarding your invoice.   Our billing staff will not be able to assist you with questions regarding bills from these companies.  You will be contacted with the lab results as soon as they are available. The fastest way to get your results is to activate your My Chart account. Instructions are located on the last page of this paperwork. If you have not heard from [facility] regarding the results in 2  weeks, please contact this office.       Hypertension, Adult High blood pressure (hypertension) is when the force of blood pumping through the arteries is too strong. The arteries are the blood vessels that carry blood from the heart throughout the body. Hypertension forces the heart to work harder to pump blood and may cause arteries to become narrow or stiff. Untreated or uncontrolled hypertension can cause a heart attack, heart failure, a stroke, kidney disease, and other problems. A blood pressure reading consists of a higher number over a lower number. Ideally, your blood pressure should be below 120/80. The first ("top") number is called the systolic pressure. It is a measure of the pressure in your arteries as your heart beats. The second ("bottom") number is called the diastolic pressure. It is a measure of the pressure in your arteries as the heart relaxes. What are the causes? The exact cause of this condition is not known. There are some conditions that result in or are related to high blood pressure. What increases the risk? Some risk factors for high blood pressure are under your control. The following factors may make you more likely to develop this condition:  Smoking.  Having type 2 diabetes mellitus, high cholesterol, or both.  Not getting enough exercise or physical activity.  Being overweight.  Having too much fat, sugar, calories, or salt (sodium) in your diet.  Drinking  too much alcohol. Some risk factors for high blood pressure may be difficult or impossible to change. Some of these factors include:  Having chronic kidney disease.  Having a family history of high blood pressure.  Age. Risk increases with age.  Race. You may be at higher risk if you are African American.  Gender. Men are at higher risk than women before age 46. After age 73, women are at higher risk than men.  Having obstructive sleep apnea.  Stress. What are the signs or symptoms? High  blood pressure may not cause symptoms. Very high blood pressure (hypertensive crisis) may cause:  Headache.  Anxiety.  Shortness of breath.  Nosebleed.  Nausea and vomiting.  Vision changes.  Severe chest pain.  Seizures. How is this diagnosed? This condition is diagnosed by measuring your blood pressure while you are seated, with your arm resting on a flat surface, your legs uncrossed, and your feet flat on the floor. The cuff of the blood pressure monitor will be placed directly against the skin of your upper arm at the level of your heart. It should be measured at least twice using the same arm. Certain conditions can cause a difference in blood pressure between your right and left arms. Certain factors can cause blood pressure readings to be lower or higher than normal for a short period of time:  When your blood pressure is higher when you are in a health care provider's office than when you are at home, this is called white coat hypertension. Most people with this condition do not need medicines.  When your blood pressure is higher at home than when you are in a health care provider's office, this is called masked hypertension. Most people with this condition may need medicines to control blood pressure. If you have a high blood pressure reading during one visit or you have normal blood pressure with other risk factors, you may be asked to:  Return on a different day to have your blood pressure checked again.  Monitor your blood pressure at home for 1 week or longer. If you are diagnosed with hypertension, you may have other blood or imaging tests to help your health care provider understand your overall risk for other conditions. How is this treated? This condition is treated by making healthy lifestyle changes, such as eating healthy foods, exercising more, and reducing your alcohol intake. Your health care provider may prescribe medicine if lifestyle changes are not enough to  get your blood pressure under control, and if:  Your systolic blood pressure is above 130.  Your diastolic blood pressure is above 80. Your personal target blood pressure may vary depending on your medical conditions, your age, and other factors. Follow these instructions at home: Eating and drinking   Eat a diet that is high in fiber and potassium, and low in sodium, added sugar, and fat. An example eating plan is called the DASH (Dietary Approaches to Stop Hypertension) diet. To eat this way: ? Eat plenty of fresh fruits and vegetables. Try to fill one half of your plate at each meal with fruits and vegetables. ? Eat whole grains, such as whole-wheat pasta, brown rice, or whole-grain bread. Fill about one fourth of your plate with whole grains. ? Eat or drink low-fat dairy products, such as skim milk or low-fat yogurt. ? Avoid fatty cuts of meat, processed or cured meats, and poultry with skin. Fill about one fourth of your plate with lean proteins, such as fish, chicken  without skin, beans, eggs, or tofu. ? Avoid pre-made and processed foods. These tend to be higher in sodium, added sugar, and fat.  Reduce your daily sodium intake. Most people with hypertension should eat less than 1,500 mg of sodium a day.  Do not drink alcohol if: ? Your health care provider tells you not to drink. ? You are pregnant, may be pregnant, or are planning to become pregnant.  If you drink alcohol: ? Limit how much you use to:  0-1 drink a day for women.  0-2 drinks a day for men. ? Be aware of how much alcohol is in your drink. In the [place] one drink equals one 12 oz bottle of beer (355 mL), one 5 oz glass of wine (148 mL), or one 1 oz glass of hard liquor (44 mL). Lifestyle   Work with your health care provider to maintain a healthy body weight or to lose weight. Ask what an ideal weight is for you.  Get at least 30 minutes of exercise most days of the week. Activities may include walking,  swimming, or biking.  Include exercise to strengthen your muscles (resistance exercise), such as Pilates or lifting weights, as part of your weekly exercise routine. Try to do these types of exercises for 30 minutes at least 3 days a week.  Do not use any products that contain nicotine or tobacco, such as cigarettes, e-cigarettes, and chewing tobacco. If you need help quitting, ask your health care provider.  Monitor your blood pressure at home as told by your health care provider.  Keep all follow-up visits as told by your health care provider. This is important. Medicines  Take over-the-counter and prescription medicines only as told by your health care provider. Follow directions carefully. Blood pressure medicines must be taken as prescribed.  Do not skip doses of blood pressure medicine. Doing this puts you at risk for problems and can make the medicine less effective.  Ask your health care provider about side effects or reactions to medicines that you should watch for. Contact a health care provider if you:  Think you are having a reaction to a medicine you are taking.  Have headaches that keep coming back (recurring).  Feel dizzy.  Have swelling in your ankles.  Have trouble with your vision. Get help right away if you:  Develop a severe headache or confusion.  Have unusual weakness or numbness.  Feel faint.  Have severe pain in your chest or abdomen.  Vomit repeatedly.  Have trouble breathing. Summary  Hypertension is when the force of blood pumping through your arteries is too strong. If this condition is not controlled, it may put you at risk for serious complications.  Your personal target blood pressure may vary depending on your medical conditions, your age, and other factors. For most people, a normal blood pressure is less than 120/80.  Hypertension is treated with lifestyle changes, medicines, or a combination of both. Lifestyle changes include losing  weight, eating a healthy, low-sodium diet, exercising more, and limiting alcohol. This information is not intended to replace advice given to you by your health care provider. Make sure you discuss any questions you have with your health care provider. Document Revised: 06/02/2018 Document Reviewed: 06/02/2018 Elsevier Patient Education  2020 [employer].   Managing Your Hypertension Hypertension is commonly called high blood pressure. This is when the force of your blood pressing against the walls of your arteries is too strong. Arteries are blood vessels that carry  blood from your heart throughout your body. Hypertension forces the heart to work harder to pump blood, and may cause the arteries to become narrow or stiff. Having untreated or uncontrolled hypertension can cause heart attack, stroke, kidney disease, and other problems. What are blood pressure readings? A blood pressure reading consists of a higher number over a lower number. Ideally, your blood pressure should be below 120/80. The first ("top") number is called the systolic pressure. It is a measure of the pressure in your arteries as your heart beats. The second ("bottom") number is called the diastolic pressure. It is a measure of the pressure in your arteries as the heart relaxes. What does my blood pressure reading mean? Blood pressure is classified into four stages. Based on your blood pressure reading, your health care provider may use the following stages to determine what type of treatment you need, if any. Systolic pressure and diastolic pressure are measured in a unit called mm Hg. Normal  Systolic pressure: below 120.  Diastolic pressure: below 80. Elevated  Systolic pressure: 120-129.  Diastolic pressure: below 80. Hypertension stage 1  Systolic pressure: 130-139.  Diastolic pressure: 80-89. Hypertension stage 2  Systolic pressure: 140 or above.  Diastolic pressure: 90 or above. What health risks are  associated with hypertension? Managing your hypertension is an important responsibility. Uncontrolled hypertension can lead to:  A heart attack.  A stroke.  A weakened blood vessel (aneurysm).  Heart failure.  Kidney damage.  Eye damage.  Metabolic syndrome.  Memory and concentration problems. What changes can I make to manage my hypertension? Hypertension can be managed by making lifestyle changes and possibly by taking medicines. Your health care provider will help you make a plan to bring your blood pressure within a normal range. Eating and drinking   Eat a diet that is high in fiber and potassium, and low in salt (sodium), added sugar, and fat. An example eating plan is called the DASH (Dietary Approaches to Stop Hypertension) diet. To eat this way: ? Eat plenty of fresh fruits and vegetables. Try to fill half of your plate at each meal with fruits and vegetables. ? Eat whole grains, such as whole wheat pasta, brown rice, or whole grain bread. Fill about one quarter of your plate with whole grains. ? Eat low-fat diary products. ? Avoid fatty cuts of meat, processed or cured meats, and poultry with skin. Fill about one quarter of your plate with lean proteins such as fish, chicken without skin, beans, eggs, and tofu. ? Avoid premade and processed foods. These tend to be higher in sodium, added sugar, and fat.  Reduce your daily sodium intake. Most people with hypertension should eat less than 1,500 mg of sodium a day.  Limit alcohol intake to no more than 1 drink a day for nonpregnant women and 2 drinks a day for men. One drink equals 12 oz of beer, 5 oz of wine, or 1 oz of hard liquor. Lifestyle  Work with your health care provider to maintain a healthy body weight, or to lose weight. Ask what an ideal weight is for you.  Get at least 30 minutes of exercise that causes your heart to beat faster (aerobic exercise) most days of the week. Activities may include walking,  swimming, or biking.  Include exercise to strengthen your muscles (resistance exercise), such as weight lifting, as part of your weekly exercise routine. Try to do these types of exercises for 30 minutes at least 3 days a week.  Do not use any products that contain nicotine or tobacco, such as cigarettes and e-cigarettes. If you need help quitting, ask your health care provider.  Control any long-term (chronic) conditions you have, such as high cholesterol or diabetes. Monitoring  Monitor your blood pressure at home as told by your health care provider. Your personal target blood pressure may vary depending on your medical conditions, your age, and other factors.  Have your blood pressure checked regularly, as often as told by your health care provider. Working with your health care provider  Review all the medicines you take with your health care provider because there may be side effects or interactions.  Talk with your health care provider about your diet, exercise habits, and other lifestyle factors that may be contributing to hypertension.  Visit your health care provider regularly. Your health care provider can help you create and adjust your plan for managing hypertension. Will I need medicine to control my blood pressure? Your health care provider may prescribe medicine if lifestyle changes are not enough to get your blood pressure under control, and if:  Your systolic blood pressure is 130 or higher.  Your diastolic blood pressure is 80 or higher. Take medicines only as told by your health care provider. Follow the directions carefully. Blood pressure medicines must be taken as prescribed. The medicine does not work as well when you skip doses. Skipping doses also puts you at risk for problems. Contact a health care provider if:  You think you are having a reaction to medicines you have taken.  You have repeated (recurrent) headaches.  You feel dizzy.  You have swelling in  your ankles.  You have trouble with your vision. Get help right away if:  You develop a severe headache or confusion.  You have unusual weakness or numbness, or you feel faint.  You have severe pain in your chest or abdomen.  You vomit repeatedly.  You have trouble breathing. Summary  Hypertension is when the force of blood pumping through your arteries is too strong. If this condition is not controlled, it may put you at risk for serious complications.  Your personal target blood pressure may vary depending on your medical conditions, your age, and other factors. For most people, a normal blood pressure is less than 120/80.  Hypertension is managed by lifestyle changes, medicines, or both. Lifestyle changes include weight loss, eating a healthy, low-sodium diet, exercising more, and limiting alcohol. This information is not intended to replace advice given to you by your health care provider. Make sure you discuss any questions you have with your health care provider. Document Revised: 01/14/2019 Document Reviewed: 08/20/2016 Elsevier Patient Education  2020 Else[employer] Inc[employer]   DASH Eating Plan DASH stands for "Dietary Approaches to Stop Hypertension." The DASH eating plan is a healthy eating plan that has been shown to reduce high blood pressure (hypertension). It may also reduce your risk for type 2 diabetes, heart disease, and stroke. The DASH eating plan may also help with weight loss. What are tips for following this plan?  General guidelines  Avoid eating more than 2,300 mg (milligrams) of salt (sodium) a day. If you have hypertension, you may need to reduce your sodium intake to 1,500 mg a day.  Limit alcohol intake to no more than 1 drink a day for nonpregnant women and 2 drinks a day for men. One drink equals 12 oz of beer, 5 oz of wine, or 1 oz of hard liquor.  Work with  your health care provider to maintain a healthy body weight or to lose weight. Ask what an ideal  weight is for you.  Get at least 30 minutes of exercise that causes your heart to beat faster (aerobic exercise) most days of the week. Activities may include walking, swimming, or biking.  Work with your health care provider or diet and nutrition specialist (dietitian) to adjust your eating plan to your individual calorie needs. Reading food labels   Check food labels for the amount of sodium per serving. Choose foods with less than 5 percent of the Daily Value of sodium. Generally, foods with less than 300 mg of sodium per serving fit into this eating plan.  To find whole grains, look for the word "whole" as the first word in the ingredient list. Shopping  Buy products labeled as "low-sodium" or "no salt added."  Buy fresh foods. Avoid canned foods and premade or frozen meals. Cooking  Avoid adding salt when cooking. Use salt-free seasonings or herbs instead of table salt or sea salt. Check with your health care provider or pharmacist before using salt substitutes.  Do not fry foods. Cook foods using healthy methods such as baking, boiling, grilling, and broiling instead.  Cook with heart-healthy oils, such as olive, canola, soybean, or sunflower oil. Meal planning  Eat a balanced diet that includes: ? 5 or more servings of fruits and vegetables each day. At each meal, try to fill half of your plate with fruits and vegetables. ? Up to 6-8 servings of whole grains each day. ? Less than 6 oz of lean meat, poultry, or fish each day. A 3-oz serving of meat is about the same size as a deck of cards. One egg equals 1 oz. ? 2 servings of low-fat dairy each day. ? A serving of nuts, seeds, or beans 5 times each week. ? Heart-healthy fats. Healthy fats called Omega-3 fatty acids are found in foods such as flaxseeds and coldwater fish, like sardines, salmon, and mackerel.  Limit how much you eat of the following: ? Canned or prepackaged foods. ? Food that is high in trans fat, such as  fried foods. ? Food that is high in saturated fat, such as fatty meat. ? Sweets, desserts, sugary drinks, and other foods with added sugar. ? Full-fat dairy products.  Do not salt foods before eating.  Try to eat at least 2 vegetarian meals each week.  Eat more home-cooked food and less restaurant, buffet, and fast food.  When eating at a restaurant, ask that your food be prepared with less salt or no salt, if possible. What foods are recommended? The items listed may not be a complete list. Talk with your dietitian about what dietary choices are best for you. Grains Whole-grain or whole-wheat bread. Whole-grain or whole-wheat pasta. Brown rice. Orpah Cobbatmeal. Quinoa. Bulgur. Whole-grain and low-sodium cereals. Pita bread. Low-fat, low-sodium crackers. Whole-wheat flour tortillas. Vegetables Fresh or frozen vegetables (raw, steamed, roasted, or grilled). Low-sodium or reduced-sodium tomato and vegetable juice. Low-sodium or reduced-sodium tomato sauce and tomato paste. Low-sodium or reduced-sodium canned vegetables. Fruits All fresh, dried, or frozen fruit. Canned fruit in natural juice (without added sugar). Meat and other protein foods Skinless chicken or [place]. Ground chicken or [place]. Pork with fat trimmed off. Fish and seafood. Egg whites. Dried beans, peas, or lentils. Unsalted nuts, nut butters, and seeds. Unsalted canned beans. Lean cuts of beef with fat trimmed off. Low-sodium, lean deli meat. Dairy Low-fat (1%) or fat-free (skim) milk. Fat-free,  low-fat, or reduced-fat cheeses. Nonfat, low-sodium ricotta or cottage cheese. Low-fat or nonfat yogurt. Low-fat, low-sodium cheese. Fats and oils Soft margarine without trans fats. Vegetable oil. Low-fat, reduced-fat, or light mayonnaise and salad dressings (reduced-sodium). Canola, safflower, olive, soybean, and sunflower oils. Avocado. Seasoning and other foods Herbs. Spices. Seasoning mixes without salt. Unsalted popcorn and pretzels.  Fat-free sweets. What foods are not recommended? The items listed may not be a complete list. Talk with your dietitian about what dietary choices are best for you. Grains Baked goods made with fat, such as croissants, muffins, or some breads. Dry pasta or rice meal packs. Vegetables Creamed or fried vegetables. Vegetables in a cheese sauce. Regular canned vegetables (not low-sodium or reduced-sodium). Regular canned tomato sauce and paste (not low-sodium or reduced-sodium). Regular tomato and vegetable juice (not low-sodium or reduced-sodium). Rosita Fire. Olives. Fruits Canned fruit in a light or heavy syrup. Fried fruit. Fruit in cream or butter sauce. Meat and other protein foods Fatty cuts of meat. Ribs. Fried meat. Tomasa Blase. Sausage. Bologna and other processed lunch meats. Salami. Fatback. Hotdogs. Bratwurst. Salted nuts and seeds. Canned beans with added salt. Canned or smoked fish. Whole eggs or egg yolks. Chicken or [place] with skin. Dairy Whole or 2% milk, cream, and half-and-half. Whole or full-fat cream cheese. Whole-fat or sweetened yogurt. Full-fat cheese. Nondairy creamers. Whipped toppings. Processed cheese and cheese spreads. Fats and oils Butter. Stick margarine. Lard. Shortening. Ghee. Bacon fat. Tropical oils, such as coconut, palm kernel, or palm oil. Seasoning and other foods Salted popcorn and pretzels. Onion salt, garlic salt, seasoned salt, table salt, and sea salt. Worcestershire sauce. Tartar sauce. Barbecue sauce. Teriyaki sauce. Soy sauce, including reduced-sodium. Steak sauce. Canned and packaged gravies. Fish sauce. Oyster sauce. Cocktail sauce. Horseradish that you find on the shelf. Ketchup. Mustard. Meat flavorings and tenderizers. Bouillon cubes. Hot sauce and Tabasco sauce. Premade or packaged marinades. Premade or packaged taco seasonings. Relishes. Regular salad dressings. Where to find more information:  National Heart, Lung, and Blood Institute:  PopSteam.is  American Heart Association: www.heart.org Summary  The DASH eating plan is a healthy eating plan that has been shown to reduce high blood pressure (hypertension). It may also reduce your risk for type 2 diabetes, heart disease, and stroke.  With the DASH eating plan, you should limit salt (sodium) intake to 2,300 mg a day. If you have hypertension, you may need to reduce your sodium intake to 1,500 mg a day.  When on the DASH eating plan, aim to eat more fresh fruits and vegetables, whole grains, lean proteins, low-fat dairy, and heart-healthy fats.  Work with your health care provider or diet and nutrition specialist (dietitian) to adjust your eating plan to your individual calorie needs. This information is not intended to replace advice given to you by your health care provider. Make sure you discuss any questions you have with your health care provider. Document Revised: 09/04/2017 Document Reviewed: 09/15/2016 Elsevier Patient Education  2020 [employer]   Low-Sodium Eating Plan Sodium, which is an element that makes up salt, helps you maintain a healthy balance of fluids in your body. Too much sodium can increase your blood pressure and cause fluid and waste to be held in your body. Your health care provider or dietitian may recommend following this plan if you have high blood pressure (hypertension), kidney disease, liver disease, or heart failure. Eating less sodium can help lower your blood pressure, reduce swelling, and protect your heart, liver, and kidneys. What are tips  for following this plan? General guidelines  Most people on this plan should limit their sodium intake to 1,500-2,000 mg (milligrams) of sodium each day. Reading food labels   The Nutrition Facts label lists the amount of sodium in one serving of the food. If you eat more than one serving, you must multiply the listed amount of sodium by the number of servings.  Choose foods with less  than 140 mg of sodium per serving.  Avoid foods with 300 mg of sodium or more per serving. Shopping  Look for lower-sodium products, often labeled as "low-sodium" or "no salt added."  Always check the sodium content even if foods are labeled as "unsalted" or "no salt added".  Buy fresh foods. ? Avoid canned foods and premade or frozen meals. ? Avoid canned, cured, or processed meats  Buy breads that have less than 80 mg of sodium per slice. Cooking  Eat more home-cooked food and less restaurant, buffet, and fast food.  Avoid adding salt when cooking. Use salt-free seasonings or herbs instead of table salt or sea salt. Check with your health care provider or pharmacist before using salt substitutes.  Cook with plant-based oils, such as canola, sunflower, or olive oil. Meal planning  When eating at a restaurant, ask that your food be prepared with less salt or no salt, if possible.  Avoid foods that contain MSG (monosodium glutamate). MSG is sometimes added to [place] food, bouillon, and some canned foods. What foods are recommended? The items listed may not be a complete list. Talk with your dietitian about what dietary choices are best for you. Grains Low-sodium cereals, including oats, puffed wheat and rice, and shredded wheat. Low-sodium crackers. Unsalted rice. Unsalted pasta. Low-sodium bread. Whole-grain breads and whole-grain pasta. Vegetables Fresh or frozen vegetables. "No salt added" canned vegetables. "No salt added" tomato sauce and paste. Low-sodium or reduced-sodium tomato and vegetable juice. Fruits Fresh, frozen, or canned fruit. Fruit juice. Meats and other protein foods Fresh or frozen (no salt added) meat, poultry, seafood, and fish. Low-sodium canned tuna and salmon. Unsalted nuts. Dried peas, beans, and lentils without added salt. Unsalted canned beans. Eggs. Unsalted nut butters. Dairy Milk. Soy milk. Cheese that is naturally low in sodium, such as ricotta  cheese, fresh mozzarella, or Swiss cheese Low-sodium or reduced-sodium cheese. Cream cheese. Yogurt. Fats and oils Unsalted butter. Unsalted margarine with no trans fat. Vegetable oils such as canola or olive oils. Seasonings and other foods Fresh and dried herbs and spices. Salt-free seasonings. Low-sodium mustard and ketchup. Sodium-free salad dressing. Sodium-free light mayonnaise. Fresh or refrigerated horseradish. Lemon juice. Vinegar. Homemade, reduced-sodium, or low-sodium soups. Unsalted popcorn and pretzels. Low-salt or salt-free chips. What foods are not recommended? The items listed may not be a complete list. Talk with your dietitian about what dietary choices are best for you. Grains Instant hot cereals. Bread stuffing, pancake, and biscuit mixes. Croutons. Seasoned rice or pasta mixes. Noodle soup cups. Boxed or frozen macaroni and cheese. Regular salted crackers. Self-rising flour. Vegetables Sauerkraut, pickled vegetables, and relishes. Olives. [place] fries. Onion rings. Regular canned vegetables (not low-sodium or reduced-sodium). Regular canned tomato sauce and paste (not low-sodium or reduced-sodium). Regular tomato and vegetable juice (not low-sodium or reduced-sodium). Frozen vegetables in sauces. Meats and other protein foods Meat or fish that is salted, canned, smoked, spiced, or pickled. Bacon, ham, sausage, hotdogs, corned beef, chipped beef, packaged lunch meats, salt pork, jerky, pickled herring, anchovies, regular canned tuna, sardines, salted nuts. Dairy Processed cheese and  cheese spreads. Cheese curds. Blue cheese. Feta cheese. String cheese. Regular cottage cheese. Buttermilk. Canned milk. Fats and oils Salted butter. Regular margarine. Ghee. Bacon fat. Seasonings and other foods Onion salt, garlic salt, seasoned salt, table salt, and sea salt. Canned and packaged gravies. Worcestershire sauce. Tartar sauce. Barbecue sauce. Teriyaki sauce. Soy sauce, including  reduced-sodium. Steak sauce. Fish sauce. Oyster sauce. Cocktail sauce. Horseradish that you find on the shelf. Regular ketchup and mustard. Meat flavorings and tenderizers. Bouillon cubes. Hot sauce and Tabasco sauce. Premade or packaged marinades. Premade or packaged taco seasonings. Relishes. Regular salad dressings. Salsa. Potato and tortilla chips. Corn chips and puffs. Salted popcorn and pretzels. Canned or dried soups. Pizza. Frozen entrees and pot pies. Summary  Eating less sodium can help lower your blood pressure, reduce swelling, and protect your heart, liver, and kidneys.  Most people on this plan should limit their sodium intake to 1,500-2,000 mg (milligrams) of sodium each day.  Canned, boxed, and frozen foods are high in sodium. Restaurant foods, fast foods, and pizza are also very high in sodium. You also get sodium by adding salt to food.  Try to cook at home, eat more fresh fruits and vegetables, and eat less fast food, canned, processed, or prepared foods. This information is not intended to replace advice given to you by your health care provider. Make sure you discuss any questions you have with your health care provider. Document Revised: 09/04/2017 Document Reviewed: 09/15/2016 Elsevier Patient Education  2020 [employer].     Why follow it? Research shows. . Those who follow the Mediterranean diet have a reduced risk of heart disease  . The diet is associated with a reduced incidence of Parkinson's and Alzheimer's diseases . People following the diet may have longer life expectancies and lower rates of chronic diseases  . The Dietary Guidelines for Americans recommends the Mediterranean diet as an eating plan to promote health and prevent disease  What Is the Mediterranean Diet?  . Healthy eating plan based on typical foods and recipes of Mediterranean-style cooking . The diet is primarily a plant based diet; these foods should make up a majority of meals   Starches -  Plant based foods should make up a majority of meals - They are an important sources of vitamins, minerals, energy, antioxidants, and fiber - Choose whole grains, foods high in fiber and minimally processed items  - Typical grain sources include wheat, oats, barley, corn, brown rice, bulgar, farro, millet, polenta, couscous  - Various types of beans include chickpeas, lentils, fava beans, black beans, white beans   Fruits  Veggies - Large quantities of antioxidant rich fruits & veggies; 6 or more servings  - Vegetables can be eaten raw or lightly drizzled with oil and cooked  - Vegetables common to the traditional Mediterranean Diet include: artichokes, arugula, beets, broccoli, brussel sprouts, cabbage, carrots, celery, collard greens, cucumbers, eggplant, kale, leeks, lemons, lettuce, mushrooms, okra, onions, peas, peppers, potatoes, pumpkin, radishes, rutabaga, shallots, spinach, sweet potatoes, turnips, zucchini - Fruits common to the Mediterranean Diet include: apples, apricots, avocados, cherries, clementines, dates, figs, grapefruits, grapes, melons, nectarines, oranges, peaches, pears, pomegranates, strawberries, tangerines  Fats - Replace butter and margarine with healthy oils, such as olive oil, canola oil, and tahini  - Limit nuts to no more than a handful a day  - Nuts include walnuts, almonds, pecans, pistachios, pine nuts  - Limit or avoid candied, honey roasted or heavily salted nuts - Olives are central to the  Mediterranean diet - can be eaten whole or used in a variety of dishes   Meats Protein - Limiting red meat: no more than a few times a month - When eating red meat: choose lean cuts and keep the portion to the size of deck of cards - Eggs: approx. 0 to 4 times a week  - Fish and lean poultry: at least 2 a week  - Healthy protein sources include, chicken, [place], lean beef, lamb - Increase intake of seafood such as tuna, salmon, trout, mackerel, shrimp, scallops - Avoid or  limit high fat processed meats such as sausage and bacon  Dairy - Include moderate amounts of low fat dairy products  - Focus on healthy dairy such as fat free yogurt, skim milk, low or reduced fat cheese - Limit dairy products higher in fat such as whole or 2% milk, cheese, ice cream  Alcohol - Moderate amounts of red wine is ok  - No more than 5 oz daily for women (all ages) and men older than age 62  - No more than 10 oz of wine daily for men younger than 66  Other - Limit sweets and other desserts  - Use herbs and spices instead of salt to flavor foods  - Herbs and spices common to the traditional Mediterranean Diet include: basil, bay leaves, chives, cloves, cumin, fennel, garlic, lavender, marjoram, mint, oregano, parsley, pepper, rosemary, sage, savory, sumac, tarragon, thyme   It's not just a diet, it's a lifestyle:  . The Mediterranean diet includes lifestyle factors typical of those in the region  . Foods, drinks and meals are best eaten with others and savored . Daily physical activity is important for overall good health . This could be strenuous exercise like running and aerobics . This could also be more leisurely activities such as walking, housework, yard-work, or taking the stairs . Moderation is the key; a balanced and healthy diet accommodates most foods and drinks . Consider portion sizes and frequency of consumption of certain foods   Meal Ideas & Options:  . Breakfast:  o Whole wheat toast or whole wheat English muffins with peanut butter & hard boiled egg o Steel cut oats topped with apples & cinnamon and skim milk  o Fresh fruit: banana, strawberries, melon, berries, peaches  o Smoothies: strawberries, bananas, greek yogurt, peanut butter o Low fat greek yogurt with blueberries and granola  o Egg white omelet with spinach and mushrooms o Breakfast couscous: whole wheat couscous, apricots, skim milk, cranberries  . Sandwiches:  o Hummus and grilled vegetables  (peppers, zucchini, squash) on whole wheat bread   o Grilled chicken on whole wheat pita with lettuce, tomatoes, cucumbers or tzatziki  o Tuna salad on whole wheat bread: tuna salad made with greek yogurt, olives, red peppers, capers, green onions o Garlic rosemary lamb pita: lamb sauted with garlic, rosemary, salt & pepper; add lettuce, cucumber, greek yogurt to pita - flavor with lemon juice and black pepper  . Seafood:  o Mediterranean grilled salmon, seasoned with garlic, basil, parsley, lemon juice and black pepper o Shrimp, lemon, and spinach whole-grain pasta salad made with low fat greek yogurt  o Seared scallops with lemon orzo  o Seared tuna steaks seasoned salt, pepper, coriander topped with tomato mixture of olives, tomatoes, olive oil, minced garlic, parsley, green onions and cappers  . Meats:  o Herbed greek chicken salad with kalamata olives, cucumber, feta  o Red bell peppers stuffed with spinach, bulgur, lean ground  beef (or lentils) & topped with feta   o Kebabs: skewers of chicken, tomatoes, onions, zucchini, squash  o [place] burgers: made with red onions, mint, dill, lemon juice, feta cheese topped with roasted red peppers . Vegetarian o Cucumber salad: cucumbers, artichoke hearts, celery, red onion, feta cheese, tossed in olive oil & lemon juice  o Hummus and whole grain pita points with a greek salad (lettuce, tomato, feta, olives, cucumbers, red onion) o Lentil soup with celery, carrots made with vegetable broth, garlic, salt and pepper  o Tabouli salad: parsley, bulgur, mint, scallions, cucumbers, tomato, radishes, lemon juice, olive oil, salt and pepper.

## 2019-10-21 NOTE — Progress Notes (Signed)
New Patient Office Visit  Subjective:  Patient ID: Kelly Hickman, female    DOB: 07/24/1971  Age: 49 y.o. MRN: [id number]  CC:  Chief Complaint  Patient presents with  . Establish Care  . Hypertension    going on 2-3 weeks     HPI Kelly Hickman presents for visit to establish care.  Concerned about her BP. Home readings indicate as high as 200/120 Asymptomatic - denies chest pain, visual changes, shob, doe, fatigue, dependent edema.  She is visibly nervous in office today - did not state why.   Adopted, but has been told a fam hx of htn in her mother.  No further complaints at this time  Past Medical History:  Diagnosis Date  . Headache   . Hypertension     Past Surgical History:  Procedure Laterality Date  . MYOMECTOMY  2003  . OVARIAN CYST REMOVAL Left 2003    Family History  Adopted: Yes  Problem Relation Age of Onset  . Hypertension Mother   . Diabetes Mother        Pre- Diabetic    Social History   Socioeconomic History  . Marital status: Married    Spouse name: Not on file  . Number of children: Not on file  . Years of education: Not on file  . Highest education level: Not on file  Occupational History  . Not on file  Tobacco Use  . Smoking status: Never Smoker  . Smokeless tobacco: Never Used  Substance and Sexual Activity  . Alcohol use: No  . Drug use: No  . Sexual activity: Not on file  Other Topics Concern  . Not on file  Social History Narrative  . Not on file   Social Determinants of Health   Financial Resource Strain:   . Difficulty of Paying Living Expenses: Not on file  Food Insecurity:   . Worried About Charity fundraiser in the Last Year: Not on file  . Ran Out of Food in the Last Year: Not on file  Transportation Needs:   . Lack of Transportation (Medical): Not on file  . Lack of Transportation (Non-Medical): Not on file  Physical Activity:   . Days of Exercise per Week: Not on file  . Minutes of Exercise per  Session: Not on file  Stress:   . Feeling of Stress : Not on file  Social Connections:   . Frequency of Communication with Friends and Family: Not on file  . Frequency of Social Gatherings with Friends and Family: Not on file  . Attends Religious Services: Not on file  . Active Member of Clubs or Organizations: Not on file  . Attends Archivist Meetings: Not on file  . Marital Status: Not on file  Intimate Partner Violence:   . Fear of Current or Ex-Partner: Not on file  . Emotionally Abused: Not on file  . Physically Abused: Not on file  . Sexually Abused: Not on file    Outpatient Medications Prior to Visit  Medication Sig Dispense Refill  . ASPIRIN PO Take 1 tablet by mouth daily as needed (pain).     No facility-administered medications prior to visit.    No Known Allergies  ROS Review of Systems  Constitutional: Negative.   Eyes: Negative.   Respiratory: Negative.   Cardiovascular: Negative.   Gastrointestinal: Negative.   Endocrine: Negative.   Genitourinary: Negative.   Musculoskeletal: Negative.   Skin: Negative.   Allergic/Immunologic: Negative.   Neurological:  Negative.   Hematological: Negative.   Psychiatric/Behavioral: Negative.   All other systems reviewed and are negative.     Objective:    Physical Exam  Constitutional: She is oriented to person, place, and time. She appears well-developed and well-nourished. No distress.  Cardiovascular: Normal rate and regular rhythm.  Pulmonary/Chest: Effort normal. No respiratory distress.  Neurological: She is alert and oriented to person, place, and time.  Skin: Skin is warm and dry. No rash noted. She is not diaphoretic. No erythema. No pallor.  Psychiatric: Her speech is normal. Judgment and thought content normal. Her mood appears anxious. She is withdrawn. Cognition and memory are normal.  Nursing note and vitals reviewed.   BP (!) 186/106   Pulse 88   Temp 98.3 F (36.8 C) (Temporal)    Ht '5\' 7"'$  (1.702 m)   Wt 249 lb 3.2 oz (113 kg)   LMP 10/21/2019   SpO2 98%   BMI 39.03 kg/m  Wt Readings from Last 3 Encounters:  10/21/19 249 lb 3.2 oz (113 kg)  02/21/14 239 lb (108.4 kg)  09/06/12 255 lb (115.7 kg)     Health Maintenance Due  Topic Date Due  . HIV Screening  09/15/1984  . PAP SMEAR-Modifier  09/15/1990  . MAMMOGRAM  09/16/2019  . COLONOSCOPY  09/16/2019    There are no preventive care reminders to display for this patient.  No results found for: TSH Lab Results  Component Value Date   WBC 5.9 02/21/2014   HGB 9.5 (L) 02/21/2014   HCT 32.0 (L) 02/21/2014   MCV 65.0 (L) 02/21/2014   PLT 313 02/21/2014   Lab Results  Component Value Date   NA 140 02/21/2014   K 4.5 02/21/2014   CO2 25 02/21/2014   GLUCOSE 88 02/21/2014   BUN 12 02/21/2014   CREATININE 0.90 02/21/2014   CALCIUM 9.1 02/21/2014   No results found for: CHOL No results found for: HDL No results found for: LDLCALC No results found for: TRIG No results found for: CHOLHDL No results found for: HGBA1C    Assessment & Plan:   Problem List Items Addressed This Visit      Cardiovascular and Mediastinum   HTN (hypertension) - Primary (Chronic)   Relevant Medications   amLODipine (NORVASC) 5 MG tablet   Other Relevant Orders   CBC with Differential   CMP14+EGFR    Other Visit Diagnoses    Screening for endocrine, metabolic and immunity disorder       Relevant Orders   CBC with Differential   TSH   Hemoglobin A1c   CMP14+EGFR   Lipid screening       Relevant Orders   Lipid Panel      Meds ordered this encounter  Medications  . amLODipine (NORVASC) 5 MG tablet    Sig: Take 1 tablet (5 mg total) by mouth daily. After two weeks, increase to '10mg'$  daily.    Dispense:  45 tablet    Refill:  0    Order Specific Question:   Supervising Provider    Answer:   Forrest Moron [id number]    Follow-up: Return in about 2 weeks (around 11/04/2019) for nurse visit bp.    PLAN  BP elevated today. Start amlodipine '5mg'$  po qd, after two weeks, increase to '10mg'$  PO qd.   Return in 2 weeks for nurse only bp check  Return in 4 weeks for provider med check  Labs drawn today, will follow up as warranted  Patient  encouraged to call clinic with any questions, comments, or concerns.  Maximiano Coss, NP

## 2019-10-22 LAB — CMP14+EGFR
ALT: 11 IU/L (ref 0–32)
AST: 15 IU/L (ref 0–40)
Albumin/Globulin Ratio: 1.2 (ref 1.2–2.2)
Albumin: 4.3 g/dL (ref 3.8–4.8)
Alkaline Phosphatase: 98 IU/L (ref 39–117)
BUN/Creatinine Ratio: 12 (ref 9–23)
BUN: 11 mg/dL (ref 6–24)
Bilirubin Total: 0.4 mg/dL (ref 0.0–1.2)
CO2: 25 mmol/L (ref 20–29)
Calcium: 9.3 mg/dL (ref 8.7–10.2)
Chloride: 101 mmol/L (ref 96–106)
Creatinine, Ser: 0.91 mg/dL (ref 0.57–1.00)
GFR calc Af Amer: 85 mL/min/{1.73_m2} (ref >59)
GFR calc non Af Amer: 74 mL/min/{1.73_m2} (ref >59)
Globulin, Total: 3.7 g/dL (ref 1.5–4.5)
Glucose: 124 mg/dL — ABNORMAL HIGH (ref 65–99)
Potassium: 3.3 mmol/L — ABNORMAL LOW (ref 3.5–5.2)
Sodium: 139 mmol/L (ref 134–144)
Total Protein: 8 g/dL (ref 6.0–8.5)

## 2019-10-22 LAB — CBC WITH DIFFERENTIAL/PLATELET
Basophils Absolute: 0 10*3/uL (ref 0.0–0.2)
Basos: 0 %
EOS (ABSOLUTE): 0 10*3/uL (ref 0.0–0.4)
Eos: 1 %
Hematocrit: 47 % — ABNORMAL HIGH (ref 34.0–46.6)
Hemoglobin: 15.5 g/dL (ref 11.1–15.9)
Immature Grans (Abs): 0 10*3/uL (ref 0.0–0.1)
Immature Granulocytes: 0 %
Lymphocytes Absolute: 1.5 10*3/uL (ref 0.7–3.1)
Lymphs: 18 %
MCH: 27.1 pg (ref 26.6–33.0)
MCHC: 33 g/dL (ref 31.5–35.7)
MCV: 82 fL (ref 79–97)
Monocytes Absolute: 0.5 10*3/uL (ref 0.1–0.9)
Monocytes: 6 %
Neutrophils Absolute: 6.2 10*3/uL (ref 1.4–7.0)
Neutrophils: 75 %
Platelets: 223 10*3/uL (ref 150–450)
RBC: 5.73 x10E6/uL — ABNORMAL HIGH (ref 3.77–5.28)
RDW: 14.6 % (ref 11.7–15.4)
WBC: 8.3 10*3/uL (ref 3.4–10.8)

## 2019-10-22 LAB — HEMOGLOBIN A1C
Est. average glucose Bld gHb Est-mCnc: 114 mg/dL
Hgb A1c MFr Bld: 5.6 % (ref 4.8–5.6)

## 2019-10-22 LAB — LIPID PANEL
Chol/HDL Ratio: 3.5 ratio (ref 0.0–4.4)
Cholesterol, Total: 178 mg/dL (ref 100–199)
HDL: 51 mg/dL (ref >39)
LDL Chol Calc (NIH): 110 mg/dL — ABNORMAL HIGH (ref 0–99)
Triglycerides: 90 mg/dL (ref 0–149)
VLDL Cholesterol Cal: 17 mg/dL (ref 5–40)

## 2019-10-22 LAB — TSH: TSH: 0.742 u[IU]/mL (ref 0.450–4.500)

## 2019-10-24 ENCOUNTER — Encounter: Payer: Self-pay | Admitting: Registered Nurse

## 2019-11-04 ENCOUNTER — Ambulatory Visit (INDEPENDENT_AMBULATORY_CARE_PROVIDER_SITE_OTHER): Payer: BC Managed Care – PPO | Admitting: Registered Nurse

## 2019-11-04 ENCOUNTER — Other Ambulatory Visit: Payer: Self-pay

## 2019-11-04 VITALS — BP 176/96

## 2019-11-04 DIAGNOSIS — Z0130 Encounter for examination of blood pressure without abnormal findings: Secondary | ICD-10-CM

## 2019-11-04 NOTE — Progress Notes (Signed)
Check BP and  It is still running high by automatic and manual.   Pt was instructed to take an additional amlodipine today and and start taking 2 tabs daily per direction of provider.   Pt has an upcoming appt in 2 weeks and we will talk about BP then.   Pt also reported home reading last night of 189/96 around 8:30pm and denies feeling light headed or dizzy.

## 2019-11-18 ENCOUNTER — Encounter: Payer: Self-pay | Admitting: Registered Nurse

## 2019-11-18 ENCOUNTER — Other Ambulatory Visit: Payer: Self-pay

## 2019-11-18 ENCOUNTER — Ambulatory Visit: Payer: BC Managed Care – PPO | Admitting: Registered Nurse

## 2019-11-18 VITALS — BP 178/103 | HR 83 | Temp 97.9°F | Ht 67.0 in | Wt 254.0 lb

## 2019-11-18 DIAGNOSIS — M7662 Achilles tendinitis, left leg: Secondary | ICD-10-CM | POA: Diagnosis not present

## 2019-11-18 DIAGNOSIS — L80 Vitiligo: Secondary | ICD-10-CM | POA: Diagnosis not present

## 2019-11-18 DIAGNOSIS — I10 Essential (primary) hypertension: Secondary | ICD-10-CM | POA: Diagnosis not present

## 2019-11-18 MED ORDER — LOSARTAN POTASSIUM 100 MG PO TABS: 100.0000 mg | ORAL_TABLET | Freq: Every day | ORAL | 3 refills | Status: DC

## 2019-11-18 MED ORDER — MELOXICAM 7.5 MG PO TABS: 7.5000 mg | ORAL_TABLET | Freq: Every day | ORAL | 0 refills | Status: DC

## 2019-11-18 NOTE — Patient Instructions (Signed)
° ° ° °  If you have lab work done today you will be contacted with your lab results within the next 2 weeks.  If you have not heard from us then please contact us. The fastest way to get your results is to register for My Chart. ° ° °IF you received an x-ray today, you will receive an invoice from [facility]. Please contact [facility] at [phone] with questions or concerns regarding your invoice.  ° °IF you received labwork today, you will receive an invoice from [facility]. Please contact [facility] at [phone] with questions or concerns regarding your invoice.  ° °Our billing staff will not be able to assist you with questions regarding bills from these companies. ° °You will be contacted with the lab results as soon as they are available. The fastest way to get your results is to activate your My Chart account. Instructions are located on the last page of this paperwork. If you have not heard from us regarding the results in 2 weeks, please contact this office. °  ° ° ° °

## 2019-11-18 NOTE — Progress Notes (Signed)
Established Patient Office Visit  Subjective:  Patient ID: Kelly Hickman, female    DOB: Feb 14, 1971  Age: 49 y.o. MRN: [id number]  CC:  Chief Complaint  Patient presents with  . Follow-up    follow up for medical conditions    HPI Kelly Hickman presents for blood pressure follow up  Still elevated despite 10mg  amlodipine po qd. Will start losartan. She monitors at home, but checks in evenings. Readings there usually 150s/90s. Discussed that she should check this in the AM if possible. BP very elevated today. No CV symptoms, feels well overall. Continuing to work to improve her diet and lifestyle but is having trouble  Also notes some posterior L heel pain. Worsening. Worse after standing for long periods, going up steps, or with any stress on L calf. Has not happened to her before.  Also notes that she has had some loss of pigmentation in patches on her face including one below each eye and one on her forehead. They have progressed over about 1 year. These have not happened before. Besides loss of pigment, no other symptoms. Cannot identify preceding factors. Adopted, no known family history of autoimmune d/o. Denies alopecia, GI distress, joint pain, etc.  Past Medical History:  Diagnosis Date  . Headache   . Hypertension     Past Surgical History:  Procedure Laterality Date  . MYOMECTOMY  2003  . OVARIAN CYST REMOVAL Left 2003    Family History  Adopted: Yes  Problem Relation Age of Onset  . Hypertension Mother   . Diabetes Mother        Pre- Diabetic    Social History   Socioeconomic History  . Marital status: Married    Spouse name: Not on file  . Number of children: Not on file  . Years of education: Not on file  . Highest education level: Not on file  Occupational History  . Not on file  Tobacco Use  . Smoking status: Never Smoker  . Smokeless tobacco: Never Used  Substance and Sexual Activity  . Alcohol use: No  . Drug use: No  . Sexual  activity: Not Currently  Other Topics Concern  . Not on file  Social History Narrative  . Not on file   Social Determinants of Health   Financial Resource Strain:   . Difficulty of Paying Living Expenses: Not on file  Food Insecurity:   . Worried About 2004 in the Last Year: Not on file  . Ran Out of Food in the Last Year: Not on file  Transportation Needs:   . Lack of Transportation (Medical): Not on file  . Lack of Transportation (Non-Medical): Not on file  Physical Activity:   . Days of Exercise per Week: Not on file  . Minutes of Exercise per Session: Not on file  Stress:   . Feeling of Stress : Not on file  Social Connections:   . Frequency of Communication with Friends and Family: Not on file  . Frequency of Social Gatherings with Friends and Family: Not on file  . Attends Religious Services: Not on file  . Active Member of Clubs or Organizations: Not on file  . Attends Programme researcher, broadcasting/film/video Meetings: Not on file  . Marital Status: Not on file  Intimate Partner Violence:   . Fear of Current or Ex-Partner: Not on file  . Emotionally Abused: Not on file  . Physically Abused: Not on file  . Sexually Abused: Not on file  Outpatient Medications Prior to Visit  Medication Sig Dispense Refill  . amLODipine (NORVASC) 5 MG tablet Take 1 tablet (5 mg total) by mouth daily. After two weeks, increase to 10mg  daily. 45 tablet 0   No facility-administered medications prior to visit.    No Known Allergies  ROS Review of Systems Per hpi     Objective:    Physical Exam  Constitutional: She is oriented to person, place, and time. She appears well-developed and well-nourished. No distress.  Cardiovascular: Normal rate, regular rhythm and normal heart sounds.  Pulmonary/Chest: Effort normal. No respiratory distress.  Musculoskeletal:     Left ankle:     Left Achilles Tendon: Pain present. No defects. Thompson's test negative.  Neurological: She is alert and  oriented to person, place, and time.  Skin: Skin is warm and dry. No rash noted. She is not diaphoretic. No erythema. No pallor.     Psychiatric: She has a normal mood and affect. Her behavior is normal. Judgment and thought content normal.  Nursing note and vitals reviewed.   BP (!) 178/103   Pulse 83   Temp 97.9 F (36.6 C) (Temporal)   Ht 5\' 7"  (1.702 m)   Wt 254 lb (115.2 kg)   LMP 10/21/2019   SpO2 99%   BMI 39.78 kg/m  Wt Readings from Last 3 Encounters:  11/18/19 254 lb (115.2 kg)  10/21/19 249 lb 3.2 oz (113 kg)  02/21/14 239 lb (108.4 kg)     Health Maintenance Due  Topic Date Due  . HIV Screening  09/15/1984  . PAP SMEAR-Modifier  09/15/1990  . COLONOSCOPY  09/16/2019    There are no preventive care reminders to display for this patient.  Lab Results  Component Value Date   TSH 0.742 10/21/2019   Lab Results  Component Value Date   WBC 8.3 10/21/2019   HGB 15.5 10/21/2019   HCT 47.0 (H) 10/21/2019   MCV 82 10/21/2019   PLT 223 10/21/2019   Lab Results  Component Value Date   NA 139 10/21/2019   K 3.3 (L) 10/21/2019   CO2 25 10/21/2019   GLUCOSE 124 (H) 10/21/2019   BUN 11 10/21/2019   CREATININE 0.91 10/21/2019   BILITOT 0.4 10/21/2019   ALKPHOS 98 10/21/2019   AST 15 10/21/2019   ALT 11 10/21/2019   PROT 8.0 10/21/2019   ALBUMIN 4.3 10/21/2019   CALCIUM 9.3 10/21/2019   Lab Results  Component Value Date   CHOL 178 10/21/2019   Lab Results  Component Value Date   HDL 51 10/21/2019   Lab Results  Component Value Date   LDLCALC 110 (H) 10/21/2019   Lab Results  Component Value Date   TRIG 90 10/21/2019   Lab Results  Component Value Date   CHOLHDL 3.5 10/21/2019   Lab Results  Component Value Date   HGBA1C 5.6 10/21/2019      Assessment & Plan:   Problem List Items Addressed This Visit      Cardiovascular and Mediastinum   HTN (hypertension) (Chronic)   Relevant Medications   losartan (COZAAR) 100 MG tablet      Other Visit Diagnoses    Vitiligo    -  Primary   Relevant Orders   Ambulatory referral to Dermatology   Achilles tendinitis of left lower extremity       Relevant Medications   meloxicam (MOBIC) 7.5 MG tablet      Meds ordered this encounter  Medications  . losartan (COZAAR)  100 MG tablet    Sig: Take 1 tablet (100 mg total) by mouth daily.    Dispense:  90 tablet    Refill:  3    Order Specific Question:   Supervising Provider    Answer:   Delia Chimes A [id number]  . meloxicam (MOBIC) 7.5 MG tablet    Sig: Take 1 tablet (7.5 mg total) by mouth daily.    Dispense:  30 tablet    Refill:  0    Order Specific Question:   Supervising Provider    Answer:   Forrest Moron [id number]    Follow-up: No follow-ups on file.   PLAN  Start losartan 100mg  PO qd  Meloxicam 7.5mg  and stretching for apparent achilles tendonitis - if no improvement, will consider PT vs. Podiatry  Refer to derm for apparent vitiligo - discussed general skin care  Return in 2 weeks for nurse visit bp check.   Patient encouraged to call clinic with any questions, comments, or concerns.  Maximiano Coss, NP

## 2019-12-02 ENCOUNTER — Ambulatory Visit: Payer: BC Managed Care – PPO

## 2019-12-07 ENCOUNTER — Other Ambulatory Visit: Payer: Self-pay

## 2019-12-07 ENCOUNTER — Ambulatory Visit (INDEPENDENT_AMBULATORY_CARE_PROVIDER_SITE_OTHER): Payer: BC Managed Care – PPO | Admitting: Registered Nurse

## 2019-12-07 VITALS — BP 140/86

## 2019-12-07 DIAGNOSIS — Z0130 Encounter for examination of blood pressure without abnormal findings: Secondary | ICD-10-CM

## 2019-12-07 DIAGNOSIS — I10 Essential (primary) hypertension: Secondary | ICD-10-CM

## 2019-12-07 MED ORDER — AMLODIPINE BESYLATE 5 MG PO TABS: 5.0000 mg | ORAL_TABLET | Freq: Every day | ORAL | 0 refills | Status: DC

## 2019-12-07 NOTE — Patient Instructions (Signed)
° ° ° °  If you have lab work done today you will be contacted with your lab results within the next 2 weeks.  If you have not heard from us then please contact us. The fastest way to get your results is to register for My Chart. ° ° °IF you received an x-ray today, you will receive an invoice from [facility]. Please contact [facility] at [phone] with questions or concerns regarding your invoice.  ° °IF you received labwork today, you will receive an invoice from [facility]. Please contact [facility] at [phone] with questions or concerns regarding your invoice.  ° °Our billing staff will not be able to assist you with questions regarding bills from these companies. ° °You will be contacted with the lab results as soon as they are available. The fastest way to get your results is to activate your My Chart account. Instructions are located on the last page of this paperwork. If you have not heard from us regarding the results in 2 weeks, please contact this office. °  ° ° ° °

## 2019-12-10 ENCOUNTER — Emergency Department (HOSPITAL_COMMUNITY): Payer: BC Managed Care – PPO

## 2019-12-10 ENCOUNTER — Emergency Department (HOSPITAL_COMMUNITY)
Admission: EM | Admit: 2019-12-10 | Discharge: 2019-12-10 | Disposition: A | Discharge status: Home / Self Care | Payer: BC Managed Care – PPO | Attending: Emergency Medicine | Admitting: Emergency Medicine

## 2019-12-10 ENCOUNTER — Other Ambulatory Visit: Payer: Self-pay

## 2019-12-10 DIAGNOSIS — Z79899 Other long term (current) drug therapy: Secondary | ICD-10-CM | POA: Insufficient documentation

## 2019-12-10 DIAGNOSIS — E860 Dehydration: Secondary | ICD-10-CM | POA: Diagnosis not present

## 2019-12-10 DIAGNOSIS — R0789 Other chest pain: Secondary | ICD-10-CM | POA: Insufficient documentation

## 2019-12-10 DIAGNOSIS — R55 Syncope and collapse: Secondary | ICD-10-CM | POA: Diagnosis not present

## 2019-12-10 DIAGNOSIS — I10 Essential (primary) hypertension: Secondary | ICD-10-CM | POA: Diagnosis not present

## 2019-12-10 DIAGNOSIS — R079 Chest pain, unspecified: Secondary | ICD-10-CM | POA: Diagnosis not present

## 2019-12-10 LAB — URINALYSIS, ROUTINE W REFLEX MICROSCOPIC
Bilirubin Urine: NEGATIVE
Glucose, UA: NEGATIVE mg/dL
Hgb urine dipstick: NEGATIVE
Ketones, ur: NEGATIVE mg/dL
Nitrite: NEGATIVE
Protein, ur: NEGATIVE mg/dL
Specific Gravity, Urine: 1.016 (ref 1.005–1.030)
pH: 5 (ref 5.0–8.0)

## 2019-12-10 LAB — CBC WITH DIFFERENTIAL/PLATELET
Abs Immature Granulocytes: 0.05 10*3/uL (ref 0.00–0.07)
Basophils Absolute: 0 10*3/uL (ref 0.0–0.1)
Basophils Relative: 0 %
Eosinophils Absolute: 0.1 10*3/uL (ref 0.0–0.5)
Eosinophils Relative: 1 %
HCT: 43.7 % (ref 36.0–46.0)
Hemoglobin: 13.9 g/dL (ref 12.0–15.0)
Immature Granulocytes: 1 %
Lymphocytes Relative: 20 %
Lymphs Abs: 1.6 10*3/uL (ref 0.7–4.0)
MCH: 26.8 pg (ref 26.0–34.0)
MCHC: 31.8 g/dL (ref 30.0–36.0)
MCV: 84.4 fL (ref 80.0–100.0)
Monocytes Absolute: 0.6 10*3/uL (ref 0.1–1.0)
Monocytes Relative: 8 %
Neutro Abs: 5.3 10*3/uL (ref 1.7–7.7)
Neutrophils Relative %: 70 %
Platelets: 192 10*3/uL (ref 150–400)
RBC: 5.18 MIL/uL — ABNORMAL HIGH (ref 3.87–5.11)
RDW: 14.5 % (ref 11.5–15.5)
WBC: 7.6 10*3/uL (ref 4.0–10.5)
nRBC: 0 % (ref 0.0–0.2)

## 2019-12-10 LAB — COMPREHENSIVE METABOLIC PANEL
ALT: 18 U/L (ref 0–44)
AST: 21 U/L (ref 15–41)
Albumin: 3.8 g/dL (ref 3.5–5.0)
Alkaline Phosphatase: 88 U/L (ref 38–126)
Anion gap: 11 (ref 5–15)
BUN: 14 mg/dL (ref 6–20)
CO2: 26 mmol/L (ref 22–32)
Calcium: 9 mg/dL (ref 8.9–10.3)
Chloride: 102 mmol/L (ref 98–111)
Creatinine, Ser: 1.21 mg/dL — ABNORMAL HIGH (ref 0.44–1.00)
GFR calc Af Amer: >60 mL/min (ref >60)
GFR calc non Af Amer: 52 mL/min — ABNORMAL LOW (ref >60)
Glucose, Bld: 105 mg/dL — ABNORMAL HIGH (ref 70–99)
Potassium: 3.6 mmol/L (ref 3.5–5.1)
Sodium: 139 mmol/L (ref 135–145)
Total Bilirubin: 0.7 mg/dL (ref 0.3–1.2)
Total Protein: 7.9 g/dL (ref 6.5–8.1)

## 2019-12-10 LAB — LACTIC ACID, PLASMA: Lactic Acid, Venous: 1.2 mmol/L (ref 0.5–1.9)

## 2019-12-10 LAB — TROPONIN I (HIGH SENSITIVITY): Troponin I (High Sensitivity): 7 ng/L (ref <18)

## 2019-12-10 LAB — I-STAT BETA HCG BLOOD, ED (MC, WL, AP ONLY): I-stat hCG, quantitative: <5 m[IU]/mL (ref <5)

## 2019-12-10 LAB — D-DIMER, QUANTITATIVE (NOT AT ARMC): D-Dimer, Quant: 0.5 ug/mL-FEU (ref 0.00–0.50)

## 2019-12-10 MED ORDER — IBUPROFEN 400 MG PO TABS
400.0000 mg | ORAL_TABLET | Freq: Once | ORAL | Status: DC
  Filled 2019-12-10: qty 1

## 2019-12-10 MED ORDER — ACETAMINOPHEN 325 MG PO TABS
650.0000 mg | ORAL_TABLET | Freq: Once | ORAL | Status: AC
  Administered 2019-12-10: 650 mg via ORAL
  Filled 2019-12-10: qty 2

## 2019-12-10 MED ORDER — SODIUM CHLORIDE 0.9 % IV BOLUS
1000.0000 mL | Freq: Once | INTRAVENOUS | Status: AC
  Administered 2019-12-10: 1000 mL via INTRAVENOUS

## 2019-12-10 MED ORDER — SODIUM CHLORIDE 0.9% FLUSH: 3.0000 mL | Freq: Once | INTRAVENOUS | Status: DC

## 2019-12-10 NOTE — Discharge Instructions (Addendum)
Continue your home medications as previously prescribed. Follow-up with your primary care provider. Return to the ED if you start to develop worsening chest pain, shortness of breath, head injuries or falls, numbness in arms or legs.

## 2019-12-10 NOTE — ED Triage Notes (Signed)
Pt bib ems from home after mom called ems when she had a hard time arousing pt. Pt received covid vaccine #2 yesterday and is complaining of fever and pain at the injection site. Pt states she remembers her mom trying to wake her up but she was too tired to say anything.  162/120 HR 108 RR 20 100% RA CBG 100 101.3F

## 2019-12-10 NOTE — ED Notes (Signed)
Please have patient call brother Karianne Nogueira @ [phone]--JJHERD

## 2019-12-10 NOTE — ED Notes (Signed)
Pt verbalized understanding of d/c instructions, follow up care and s/s requiring return toed. Pt had no additional questions at this time. 

## 2019-12-10 NOTE — ED Provider Notes (Signed)
[facility] EMERGENCY DEPARTMENT Provider Note   CSN: [id number] Arrival date & time: 12/10/19  1848     History Chief Complaint  Patient presents with  . Loss of Consciousness  . Fatigue  . Chest Pain    Kelly Hickman is a 49 y.o. female with a past medical history of hypertension presenting to the ED with a chief complaint of syncope and chest pain.  States that she woke up this morning in her usual state of health but did feel slightly fatigued.  She received her second Covid vaccine yesterday.  Reports pain in her arm since then.  Also has been having left-sided chest pain.  Her mother told her that she lost consciousness and was difficult to arouse just prior to arrival which prompted her visit to the ER.  Patient remembers coming home from work changing and then remembers waking up with EMS surrounding her.  She has had chest pain since then.  Denies any headache, vision changes, shortness of breath, numbness in arms or legs, injuries or falls.  She denies history of PE, DVT, MI.  Reports normal activity and appetite level, denies any urinary symptoms, diarrhea, hemoptysis, anticoagulant use.  HPI     Past Medical History:  Diagnosis Date  . Headache   . Hypertension     Patient Active Problem List   Diagnosis Date Noted  . Vitiligo 11/18/2019  . Achilles tendinitis of left lower extremity 11/18/2019  . HTN (hypertension) 09/06/2012    Past Surgical History:  Procedure Laterality Date  . MYOMECTOMY  2003  . OVARIAN CYST REMOVAL Left 2003     OB History   No obstetric history on file.     Family History  Adopted: Yes  Problem Relation Age of Onset  . Hypertension Mother   . Diabetes Mother        Pre- Diabetic    Social History   Tobacco Use  . Smoking status: Never Smoker  . Smokeless tobacco: Never Used  Substance Use Topics  . Alcohol use: No  . Drug use: No    Home Medications Prior to Admission medications   Medication  Sig Start Date End Date Taking? Authorizing Provider  amLODipine (NORVASC) 5 MG tablet Take 1 tablet (5 mg total) by mouth daily. After two weeks, increase to 10mg  daily. 12/07/19  Yes 02/06/20, NP  losartan (COZAAR) 100 MG tablet Take 1 tablet (100 mg total) by mouth daily. 11/18/19  Yes 01/16/20, NP  meloxicam (MOBIC) 7.5 MG tablet Take 1 tablet (7.5 mg total) by mouth daily. 11/18/19  Yes 01/16/20, NP    Allergies    Patient has no known allergies.  Review of Systems   Review of Systems  Constitutional: Negative for appetite change, chills and fever.  HENT: Negative for ear pain, rhinorrhea, sneezing and sore throat.   Eyes: Negative for photophobia and visual disturbance.  Respiratory: Negative for cough, chest tightness, shortness of breath and wheezing.   Cardiovascular: Positive for chest pain. Negative for palpitations.  Gastrointestinal: Negative for abdominal pain, blood in stool, constipation, diarrhea, nausea and vomiting.  Genitourinary: Negative for dysuria, hematuria and urgency.  Musculoskeletal: Negative for myalgias.  Skin: Negative for rash.  Neurological: Positive for syncope. Negative for dizziness, weakness and light-headedness.    Physical Exam Updated Vital Signs BP (!) 163/88   Pulse 82   Temp 98.3 F (36.8 C) (Oral)   Resp (!) 22   SpO2 100%   Physical Exam  Vitals and nursing note reviewed.  Constitutional:      General: She is not in acute distress.    Appearance: She is well-developed. She is obese.  HENT:     Head: Normocephalic and atraumatic.     Nose: Nose normal.  Eyes:     General: No scleral icterus.       Right eye: No discharge.        Left eye: No discharge.     Conjunctiva/sclera: Conjunctivae normal.     Pupils: Pupils are equal, round, and reactive to light.  Cardiovascular:     Rate and Rhythm: Normal rate and regular rhythm.     Heart sounds: Normal heart sounds. No murmur. No friction rub. No gallop.     Pulmonary:     Effort: Pulmonary effort is normal. No respiratory distress.     Breath sounds: Normal breath sounds.  Abdominal:     General: Bowel sounds are normal. There is no distension.     Palpations: Abdomen is soft.     Tenderness: There is no abdominal tenderness. There is no guarding.  Musculoskeletal:        General: Normal range of motion.     Cervical back: Normal range of motion and neck supple.     Right lower leg: No edema.     Left lower leg: No edema.  Skin:    General: Skin is warm and dry.     Findings: No rash.  Neurological:     General: No focal deficit present.     Mental Status: She is alert and oriented to person, place, and time.     Cranial Nerves: No cranial nerve deficit.     Sensory: No sensory deficit.     Motor: No weakness or abnormal muscle tone.     Coordination: Coordination normal.     ED Results / Procedures / Treatments   Labs (all labs ordered are listed, but only abnormal results are displayed) Labs Reviewed  COMPREHENSIVE METABOLIC PANEL - Abnormal; Notable for the following components:      Result Value   Glucose, Bld 105 (*)    Creatinine, Ser 1.21 (*)    GFR calc non Af Amer 52 (*)    All other components within normal limits  CBC WITH DIFFERENTIAL/PLATELET - Abnormal; Notable for the following components:   RBC 5.18 (*)    All other components within normal limits  URINALYSIS, ROUTINE W REFLEX MICROSCOPIC - Abnormal; Notable for the following components:   APPearance HAZY (*)    Leukocytes,Ua TRACE (*)    Bacteria, UA MANY (*)    All other components within normal limits  LACTIC ACID, PLASMA  D-DIMER, QUANTITATIVE (NOT AT [facility])  I-STAT BETA HCG BLOOD, ED ([facility], [facility], [facility] ONLY)  TROPONIN I (HIGH SENSITIVITY)    EKG EKG Interpretation  Date/Time:  Saturday December 10 2019 22:52:49 EST Ventricular Rate:  79 PR Interval:    QRS Duration: 104 QT Interval:  394 QTC Calculation: 452 R Axis:   -9 Text Interpretation: Sinus  rhythm Abnormal R-wave progression, early transition Left ventricular hypertrophy No acute changes No significant change since last tracing Confirmed by Derwood Kaplan [phone]) on 12/10/2019 11:35:32 PM   Radiology DG Chest 2 View  Result Date: 12/10/2019 CLINICAL DATA:  Chest pain EXAM: CHEST - 2 VIEW COMPARISON:  Feb 21, 2014 FINDINGS: The heart size and mediastinal contours are within normal limits. Both lungs are clear. The visualized skeletal structures are unremarkable. IMPRESSION:  No active cardiopulmonary disease. Electronically Signed   By: Katherine Mantle M.D.   On: 12/10/2019 23:14    Procedures Procedures (including critical care time)  Medications Ordered in ED Medications  sodium chloride flush (NS) 0.9 % injection 3 mL (has no administration in time range)  sodium chloride 0.9 % bolus 1,000 mL (1,000 mLs Intravenous New Bag/Given 12/10/19 2246)  acetaminophen (TYLENOL) tablet 650 mg (650 mg Oral Given 12/10/19 2325)    ED Course  I have reviewed the triage vital signs and the nursing notes.  Pertinent labs & imaging results that were available during my care of the patient were reviewed by me and considered in my medical decision making (see chart for details).  Clinical Course as of Dec 10 2343  Sat Dec 10, 2019  2214 [facility], mother [phone]   [HK]  2228 Attempted to call mother for further history without success.   [HK]  2233 Pulse Rate(!): 119 [HK]  2241 Creatinine(!): 1.21 [HK]  2241 D-Dimer, Quant: 0.50 [HK]  2328 Patient with improvement in her symptoms with fluids and medications.   [HK]    Clinical Course User Index [HK] Dietrich Pates, PA-C   MDM Rules/Calculators/A&P                      49 year old female with a past medical history of hypertension presents to the ED for a chief complaint of syncope and chest pain.  Woke up this morning in her usual state of health, felt slightly fatigued.  She received her second Covid vaccine yesterday.  Remembers  going to work, coming home, changing her close but does not remember what happened until she was woken up by EMS.  Mother was concerned that she lost consciousness and was difficult to arouse.  She is having chest pain which is described as sharp, stabbing and left-sided.  Pain has been intermittent.  Denies any shortness of breath, vomiting, diarrhea, head ache.  On exam patient is overall well-appearing.  No deficits neurological exam noted.  No lower extremity edema, erythema or calf tenderness noted concerning for DVT.  She has been complete sentences without difficulty.  Initial tachycardic on arrival.  She is not hypoxic.  She is hypertensive here.  I have attempted to call her mother several times without answer to get more of the history of what went on today.  I am concerned due to her chest pain and syncope.  We will add troponin, EKG and D-dimer to rule out PE.  EKG without any acute ischemic changes.  D-dimer is negative so no need for CTA to rule out PE.  Troponin is negative.  Went to recheck patient, states that she has had significant improvement in her pain with Tylenol and IV fluids.  She is requesting discharge home.  I believe some component of dehydration as her creatinine is slightly elevated than her baseline.  I doubt ACS, PE or other cardiac emergent cause of her symptoms.  She is comfortable with discharge, PCP follow-up and continue her home medications.    Patient is hemodynamically stable, in NAD, and able to ambulate in the ED. Evaluation does not show pathology that would require ongoing emergent intervention or inpatient treatment. I have personally reviewed and interpreted all lab work and imaging at today's ED visit. I explained the diagnosis to the patient. Pain has been managed and has no complaints prior to discharge. Patient is comfortable with above plan and is stable for discharge at  this time. All questions were answered prior to disposition. Strict return precautions for  returning to the ED were discussed. Encouraged follow up with PCP.   An After Visit Summary was printed and given to the patient.   Portions of this note were generated with Lobbyist. Dictation errors may occur despite best attempts at proofreading.   Final Clinical Impression(s) / ED Diagnoses Final diagnoses:  Chest wall pain  Dehydration    Rx / DC Orders ED Discharge Orders    None      Portions of this note were generated with Dragon dictation software. Dictation errors may occur despite best attempts at proofreading.    Delia Heady, PA-C 12/10/19 [place], Ankit, MD 12/11/19 [phone]

## 2019-12-10 NOTE — ED Notes (Signed)
Pt is phoning her brother at this time.

## 2019-12-12 ENCOUNTER — Encounter: Payer: Self-pay | Admitting: Registered Nurse

## 2019-12-12 ENCOUNTER — Ambulatory Visit: Payer: BC Managed Care – PPO | Admitting: Registered Nurse

## 2019-12-12 ENCOUNTER — Other Ambulatory Visit: Payer: Self-pay

## 2019-12-12 VITALS — BP 170/90 | HR 67 | Temp 98.1°F | Resp 18 | Ht 67.0 in | Wt 263.8 lb

## 2019-12-12 DIAGNOSIS — R7989 Other specified abnormal findings of blood chemistry: Secondary | ICD-10-CM

## 2019-12-12 DIAGNOSIS — R509 Fever, unspecified: Secondary | ICD-10-CM

## 2019-12-12 DIAGNOSIS — N3000 Acute cystitis without hematuria: Secondary | ICD-10-CM

## 2019-12-12 DIAGNOSIS — I10 Essential (primary) hypertension: Secondary | ICD-10-CM | POA: Diagnosis not present

## 2019-12-12 MED ORDER — LOSARTAN POTASSIUM 100 MG PO TABS: 100.0000 mg | ORAL_TABLET | Freq: Two times a day (BID) | ORAL | 1 refills | Status: DC

## 2019-12-12 MED ORDER — SULFAMETHOXAZOLE-TRIMETHOPRIM 800-160 MG PO TABS: 1.0000 | ORAL_TABLET | Freq: Two times a day (BID) | ORAL | 0 refills | Status: AC

## 2019-12-12 MED ORDER — AMLODIPINE BESYLATE 10 MG PO TABS: 10.0000 mg | ORAL_TABLET | Freq: Every day | ORAL | 3 refills | Status: DC

## 2019-12-12 NOTE — Patient Instructions (Signed)
° ° ° °  If you have lab work done today you will be contacted with your lab results within the next 2 weeks.  If you have not heard from us then please contact us. The fastest way to get your results is to register for My Chart. ° ° °IF you received an x-ray today, you will receive an invoice from [facility]. Please contact [facility] at [phone] with questions or concerns regarding your invoice.  ° °IF you received labwork today, you will receive an invoice from [facility]. Please contact [facility] at [phone] with questions or concerns regarding your invoice.  ° °Our billing staff will not be able to assist you with questions regarding bills from these companies. ° °You will be contacted with the lab results as soon as they are available. The fastest way to get your results is to activate your My Chart account. Instructions are located on the last page of this paperwork. If you have not heard from us regarding the results in 2 weeks, please contact this office. °  ° ° ° °

## 2019-12-12 NOTE — Progress Notes (Signed)
Acute Office Visit  Subjective:    Patient ID: Kelly Hickman, female    DOB: September 11, 1971, 49 y.o.   MRN: [id number]  Chief Complaint  Patient presents with  . hospital visit follow up    was seen in ED x2 days ago for syncope; no memory of what happen; states she has no issues at this time    HPI Patient is in today for hospital follow up  On Saturday after work, was feeling poorly when her mother tried to rouse her she wouldn't wake easily. Her mother called 911, she was transferred to ED via EMS.  After work up, cardiac, respiratory, and neuro etiologies ruled out, but UTI noted. They asked her to follow up with PCP Stated likely dehydration contributed to syncopal episode.  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 She also did not eat anything on Saturday.   She recently received the second dose of the COVID vaccine.   Feeling much improved today - concerned for UTI   Past Medical History:  Diagnosis Date  . Headache   . Hypertension     Past Surgical History:  Procedure Laterality Date  . MYOMECTOMY  2003  . OVARIAN CYST REMOVAL Left 2003    Family History  Adopted: Yes  Problem Relation Age of Onset  . Hypertension Mother   . Diabetes Mother        Pre- Diabetic    Social History   Socioeconomic History  . Marital status: Single    Spouse name: Not on file  . Number of children: Not on file  . Years of education: Not on file  . Highest education level: Not on file  Occupational History  . Not on file  Tobacco Use  . Smoking status: Never Smoker  . Smokeless tobacco: Never Used  Substance and Sexual Activity  . Alcohol use: No  . Drug use: No  . Sexual activity: Not Currently  Other Topics Concern  . Not on file  Social History Narrative  . Not on file   Social Determinants of Health   Financial Resource Strain:   . Difficulty of Paying Living Expenses: Not on  file  Food Insecurity:   . Worried About Programme researcher, broadcasting/film/video in the Last Year: Not on file  . Ran Out of Food in the Last Year: Not on file  Transportation Needs:   . Lack of Transportation (Medical): Not on file  . Lack of Transportation (Non-Medical): Not on file  Physical Activity:   . Days of Exercise per Week: Not on file  . Minutes of Exercise per Session: Not on file  Stress:   . Feeling of Stress : Not on file  Social Connections:   . Frequency of Communication with Friends and Family: Not on file  . Frequency of Social Gatherings with Friends and Family: Not on file  . Attends Religious Services: Not on file  . Active Member of Clubs or Organizations: Not on file  . Attends Banker Meetings: Not on file  . Marital Status: Not on file  Intimate Partner Violence:   . Fear of Current or Ex-Partner: Not on file  . Emotionally Abused: Not on file  . Physically Abused: Not on file  . Sexually Abused: Not on file    Outpatient Medications Prior to Visit  Medication Sig Dispense Refill  . amLODipine (NORVASC) 5 MG tablet Take 1  tablet (5 mg total) by mouth daily. After two weeks, increase to 10mg  daily. 45 tablet 0  . losartan (COZAAR) 100 MG tablet Take 1 tablet (100 mg total) by mouth daily. 90 tablet 3  . meloxicam (MOBIC) 7.5 MG tablet Take 1 tablet (7.5 mg total) by mouth daily. 30 tablet 0   No facility-administered medications prior to visit.    No Known Allergies  Review of Systems  Constitutional: Negative.   HENT: Negative.   Eyes: Negative.   Respiratory: Negative.   Cardiovascular: Negative.   Gastrointestinal: Negative.   Endocrine: Negative.   Genitourinary: Negative.   Musculoskeletal: Negative.   Skin: Negative.   Allergic/Immunologic: Negative.   Neurological: Negative.   Hematological: Negative.   Psychiatric/Behavioral: Negative.   All other systems reviewed and are negative.      Objective:    Physical Exam Vitals and  nursing note reviewed.  Constitutional:      General: She is not in acute distress.    Appearance: Normal appearance. She is obese. She is not ill-appearing, toxic-appearing or diaphoretic.  Cardiovascular:     Rate and Rhythm: Normal rate and regular rhythm.     Pulses: Normal pulses.     Heart sounds: Normal heart sounds. No murmur. No friction rub. No gallop.   Pulmonary:     Effort: Pulmonary effort is normal. No respiratory distress.     Breath sounds: Normal breath sounds. No stridor. No wheezing, rhonchi or rales.  Chest:     Chest wall: No tenderness.  Skin:    Capillary Refill: Capillary refill takes 2 to 3 seconds.  Neurological:     General: No focal deficit present.     Mental Status: She is alert and oriented to person, place, and time. Mental status is at baseline.  Psychiatric:        Mood and Affect: Mood normal.        Behavior: Behavior normal.        Thought Content: Thought content normal.        Judgment: Judgment normal.     BP (!) 170/90   Pulse 67   Temp 98.1 F (36.7 C) (Oral)   Resp 18   Ht 5\' 7"  (1.702 m)   Wt 263 lb 12.8 oz (119.7 kg)   SpO2 99%   BMI 41.32 kg/m  Wt Readings from Last 3 Encounters:  12/12/19 263 lb 12.8 oz (119.7 kg)  11/18/19 254 lb (115.2 kg)  10/21/19 249 lb 3.2 oz (113 kg)    Health Maintenance Due  Topic Date Due  . HIV Screening  09/15/1984  . PAP SMEAR-Modifier  09/15/1990  . COLONOSCOPY  09/16/2019    There are no preventive care reminders to display for this patient.   Lab Results  Component Value Date   TSH 0.742 10/21/2019   Lab Results  Component Value Date   WBC 7.6 12/10/2019   HGB 13.9 12/10/2019   HCT 43.7 12/10/2019   MCV 84.4 12/10/2019   PLT 192 12/10/2019   Lab Results  Component Value Date   NA 139 12/10/2019   K 3.6 12/10/2019   CO2 26 12/10/2019   GLUCOSE 105 (H) 12/10/2019   BUN 14 12/10/2019   CREATININE 1.21 (H) 12/10/2019   BILITOT 0.7 12/10/2019   ALKPHOS 88 12/10/2019    AST 21 12/10/2019   ALT 18 12/10/2019   PROT 7.9 12/10/2019   ALBUMIN 3.8 12/10/2019   CALCIUM 9.0 12/10/2019   ANIONGAP 11 12/10/2019  Lab Results  Component Value Date   CHOL 178 10/21/2019   Lab Results  Component Value Date   HDL 51 10/21/2019   Lab Results  Component Value Date   LDLCALC 110 (H) 10/21/2019   Lab Results  Component Value Date   TRIG 90 10/21/2019   Lab Results  Component Value Date   CHOLHDL 3.5 10/21/2019   Lab Results  Component Value Date   HGBA1C 5.6 10/21/2019       Assessment & Plan:   Problem List Items Addressed This Visit    None    Visit Diagnoses    Elevated serum creatinine    -  Primary   Relevant Orders   Comprehensive metabolic panel   Acute cystitis without hematuria       Relevant Medications   sulfamethoxazole-trimethoprim (BACTRIM DS) 800-160 MG tablet   Other Relevant Orders   Urine Culture   Fever, unspecified       Relevant Orders   Novel Coronavirus, NAA ([facility])       Meds ordered this encounter  Medications  . sulfamethoxazole-trimethoprim (BACTRIM DS) 800-160 MG tablet    Sig: Take 1 tablet by mouth 2 (two) times daily.    Dispense:  6 tablet    Refill:  0    Order Specific Question:   Supervising Provider    Answer:   Forrest Moron [id number]   PLAN  Discussed that her symptoms are likely related to dehydration, low blood sugar, and malaise from second covid vaccine shot. These are easy to remedy  Bactrim po bid for UTI - culture sent, will change tx if needed  Return in 2 weeks for BP check - increase amlodipine to 10mg  PO qd and losartan 100mg  PO bid  Repeat cmp for elevated cr at ED  Patient encouraged to call clinic with any questions, comments, or concerns.   Maximiano Coss, NP

## 2019-12-13 LAB — COMPREHENSIVE METABOLIC PANEL
ALT: 17 IU/L (ref 0–32)
AST: 19 IU/L (ref 0–40)
Albumin/Globulin Ratio: 1.3 (ref 1.2–2.2)
Albumin: 3.9 g/dL (ref 3.8–4.8)
Alkaline Phosphatase: 90 IU/L (ref 39–117)
BUN/Creatinine Ratio: 16 (ref 9–23)
BUN: 14 mg/dL (ref 6–24)
Bilirubin Total: <0.2 mg/dL (ref 0.0–1.2)
CO2: 25 mmol/L (ref 20–29)
Calcium: 9.1 mg/dL (ref 8.7–10.2)
Chloride: 103 mmol/L (ref 96–106)
Creatinine, Ser: 0.89 mg/dL (ref 0.57–1.00)
GFR calc Af Amer: 87 mL/min/{1.73_m2} (ref >59)
GFR calc non Af Amer: 76 mL/min/{1.73_m2} (ref >59)
Globulin, Total: 3.1 g/dL (ref 1.5–4.5)
Glucose: 92 mg/dL (ref 65–99)
Potassium: 4 mmol/L (ref 3.5–5.2)
Sodium: 139 mmol/L (ref 134–144)
Total Protein: 7 g/dL (ref 6.0–8.5)

## 2019-12-13 LAB — NOVEL CORONAVIRUS, NAA: SARS-CoV-2, NAA: NOT DETECTED

## 2019-12-15 ENCOUNTER — Encounter: Payer: Self-pay | Admitting: Registered Nurse

## 2019-12-15 LAB — URINE CULTURE

## 2019-12-15 NOTE — Progress Notes (Signed)
Labs wnl Given bactrim for UTI - culture shows susceptibility Letter sent via MyChart  Jari Sportsman, NP

## 2019-12-31 ENCOUNTER — Other Ambulatory Visit: Payer: Self-pay | Admitting: Registered Nurse

## 2019-12-31 DIAGNOSIS — I10 Essential (primary) hypertension: Secondary | ICD-10-CM

## 2020-01-05 DIAGNOSIS — L80 Vitiligo: Secondary | ICD-10-CM | POA: Diagnosis not present

## 2020-08-14 ENCOUNTER — Telehealth: Payer: Self-pay | Admitting: *Deleted

## 2020-08-14 NOTE — Telephone Encounter (Signed)
No voicemail  Schedule a mammogram

## 2020-11-04 ENCOUNTER — Other Ambulatory Visit: Payer: Self-pay | Admitting: Registered Nurse

## 2020-11-04 DIAGNOSIS — I10 Essential (primary) hypertension: Secondary | ICD-10-CM

## 2020-11-04 DIAGNOSIS — M7662 Achilles tendinitis, left leg: Secondary | ICD-10-CM

## 2020-11-26 ENCOUNTER — Other Ambulatory Visit: Payer: Self-pay

## 2020-11-26 ENCOUNTER — Ambulatory Visit: Payer: BC Managed Care – PPO | Admitting: Registered Nurse

## 2020-11-26 ENCOUNTER — Encounter: Payer: Self-pay | Admitting: Registered Nurse

## 2020-11-26 VITALS — BP 126/84 | HR 66 | Temp 98.0°F | Resp 18 | Ht 67.0 in | Wt 271.8 lb

## 2020-11-26 DIAGNOSIS — Z130 Encounter for screening for diseases of the blood and blood-forming organs and certain disorders involving the immune mechanism: Secondary | ICD-10-CM

## 2020-11-26 DIAGNOSIS — Z13228 Encounter for screening for other metabolic disorders: Secondary | ICD-10-CM

## 2020-11-26 DIAGNOSIS — I10 Essential (primary) hypertension: Secondary | ICD-10-CM

## 2020-11-26 DIAGNOSIS — Z0001 Encounter for general adult medical examination with abnormal findings: Secondary | ICD-10-CM

## 2020-11-26 DIAGNOSIS — Z1211 Encounter for screening for malignant neoplasm of colon: Secondary | ICD-10-CM

## 2020-11-26 DIAGNOSIS — Z1329 Encounter for screening for other suspected endocrine disorder: Secondary | ICD-10-CM

## 2020-11-26 DIAGNOSIS — Z13220 Encounter for screening for lipoid disorders: Secondary | ICD-10-CM | POA: Diagnosis not present

## 2020-11-26 MED ORDER — AMLODIPINE BESYLATE 10 MG PO TABS: 10.0000 mg | ORAL_TABLET | Freq: Every day | ORAL | 3 refills | Status: DC

## 2020-11-26 MED ORDER — LOSARTAN POTASSIUM 100 MG PO TABS: 100.0000 mg | ORAL_TABLET | Freq: Every day | ORAL | 1 refills | Status: DC

## 2020-11-26 NOTE — Progress Notes (Signed)
Established Patient Office Visit  Subjective:  Patient ID: Kelly Hickman, female    DOB: [birth year]  Age: 50 y.o. MRN: [id number]  CC:  Chief Complaint  Patient presents with  . Annual Exam    Patient states she is here for an CPE. Per patient she has no concerns.    HPI Kelly Hickman presents for CPE and labs  No acute concerns  HTN well controlled, no CV symptoms.   Due for colon ca screening - opts for cologuard after shared decision making.   Past Medical History:  Diagnosis Date  . Headache   . Hypertension     Past Surgical History:  Procedure Laterality Date  . MYOMECTOMY  2003  . OVARIAN CYST REMOVAL Left 2003    Family History  Adopted: Yes  Problem Relation Age of Onset  . Hypertension Mother   . Diabetes Mother        Pre- Diabetic    Social History   Socioeconomic History  . Marital status: Single    Spouse name: Not on file  . Number of children: Not on file  . Years of education: Not on file  . Highest education level: Not on file  Occupational History  . Not on file  Tobacco Use  . Smoking status: Never Smoker  . Smokeless tobacco: Never Used  Vaping Use  . Vaping Use: Never used  Substance and Sexual Activity  . Alcohol use: No  . Drug use: No  . Sexual activity: Not Currently  Other Topics Concern  . Not on file  Social History Narrative  . Not on file   Social Determinants of Health   Financial Resource Strain: Not on file  Food Insecurity: Not on file  Transportation Needs: Not on file  Physical Activity: Not on file  Stress: Not on file  Social Connections: Not on file  Intimate Partner Violence: Not on file    Outpatient Medications Prior to Visit  Medication Sig Dispense Refill  . meloxicam (MOBIC) 7.5 MG tablet TAKE 1 TABLET BY MOUTH EVERY DAY 30 tablet 0  . sulfamethoxazole-trimethoprim (BACTRIM DS) 800-160 MG tablet Take 1 tablet by mouth 2 (two) times daily. 6 tablet 0  . amLODipine (NORVASC) 10 MG  tablet Take 1 tablet (10 mg total) by mouth daily. 90 tablet 3  . losartan (COZAAR) 100 MG tablet Take 1 tablet (100 mg total) by mouth 2 (two) times daily. 90 tablet 1   No facility-administered medications prior to visit.    No Known Allergies  ROS Review of Systems  Constitutional: Negative.   HENT: Negative.   Eyes: Negative.   Respiratory: Negative.   Cardiovascular: Negative.   Gastrointestinal: Negative.   Genitourinary: Negative.   Musculoskeletal: Negative.   Skin: Negative.   Neurological: Negative.   Psychiatric/Behavioral: Negative.   All other systems reviewed and are negative.     Objective:    Physical Exam Vitals and nursing note reviewed.  Constitutional:      General: She is not in acute distress.    Appearance: Normal appearance. She is normal weight. She is not ill-appearing, toxic-appearing or diaphoretic.  HENT:     Head: Normocephalic and atraumatic.     Right Ear: Tympanic membrane, ear canal and external ear normal. There is no impacted cerumen.     Left Ear: Tympanic membrane, ear canal and external ear normal. There is no impacted cerumen.     Nose: Nose normal. No congestion or rhinorrhea.  Mouth/Throat:     Mouth: Mucous membranes are moist.     Pharynx: Oropharynx is clear. No oropharyngeal exudate or posterior oropharyngeal erythema.  Eyes:     General: No scleral icterus.       Right eye: No discharge.        Left eye: No discharge.     Extraocular Movements: Extraocular movements intact.     Conjunctiva/sclera: Conjunctivae normal.     Pupils: Pupils are equal, round, and reactive to light.  Cardiovascular:     Rate and Rhythm: Normal rate and regular rhythm.     Pulses: Normal pulses.     Heart sounds: Normal heart sounds. No murmur heard. No friction rub. No gallop.   Pulmonary:     Effort: Pulmonary effort is normal. No respiratory distress.     Breath sounds: Normal breath sounds. No stridor. No wheezing, rhonchi or rales.   Chest:     Chest wall: No tenderness.  Abdominal:     General: Abdomen is flat. Bowel sounds are normal. There is no distension.     Palpations: Abdomen is soft. There is no mass.     Tenderness: There is no abdominal tenderness. There is no right CVA tenderness, left CVA tenderness, guarding or rebound.     Hernia: No hernia is present.  Musculoskeletal:        General: No swelling, tenderness, deformity or signs of injury. Normal range of motion.     Right lower leg: No edema.     Left lower leg: No edema.  Skin:    General: Skin is warm and dry.     Capillary Refill: Capillary refill takes less than 2 seconds.     Coloration: Skin is not jaundiced or pale.     Findings: No bruising, erythema, lesion or rash.  Neurological:     General: No focal deficit present.     Mental Status: She is alert and oriented to person, place, and time. Mental status is at baseline.     Cranial Nerves: No cranial nerve deficit.     Sensory: No sensory deficit.     Motor: No weakness.     Coordination: Coordination normal.     Gait: Gait normal.     Deep Tendon Reflexes: Reflexes normal.  Psychiatric:        Mood and Affect: Mood normal.        Behavior: Behavior normal.        Thought Content: Thought content normal.        Judgment: Judgment normal.     BP 126/84 (BP Location: Left Arm, Patient Position: Sitting, Cuff Size: Large)   Pulse 66   Temp 98 F (36.7 C) (Temporal)   Resp 18   Ht 5\' 7"  (1.702 m)   Wt 271 lb 12.8 oz (123.3 kg)   SpO2 100%   BMI 42.57 kg/m  Wt Readings from Last 3 Encounters:  11/26/20 271 lb 12.8 oz (123.3 kg)  12/12/19 263 lb 12.8 oz (119.7 kg)  11/18/19 254 lb (115.2 kg)     Health Maintenance Due  Topic Date Due  . COLONOSCOPY (Pts 45-44yrs Insurance coverage will need to be confirmed)  Never done  . MAMMOGRAM  09/16/2019    There are no preventive care reminders to display for this patient.  Lab Results  Component Value Date   TSH 0.742  10/21/2019   Lab Results  Component Value Date   WBC 7.6 12/10/2019   HGB 13.9 12/10/2019  HCT 43.7 12/10/2019   MCV 84.4 12/10/2019   PLT 192 12/10/2019   Lab Results  Component Value Date   NA 139 12/12/2019   K 4.0 12/12/2019   CO2 25 12/12/2019   GLUCOSE 92 12/12/2019   BUN 14 12/12/2019   CREATININE 0.89 12/12/2019   BILITOT <0.2 12/12/2019   ALKPHOS 90 12/12/2019   AST 19 12/12/2019   ALT 17 12/12/2019   PROT 7.0 12/12/2019   ALBUMIN 3.9 12/12/2019   CALCIUM 9.1 12/12/2019   ANIONGAP 11 12/10/2019   Lab Results  Component Value Date   CHOL 178 10/21/2019   Lab Results  Component Value Date   HDL 51 10/21/2019   Lab Results  Component Value Date   LDLCALC 110 (H) 10/21/2019   Lab Results  Component Value Date   TRIG 90 10/21/2019   Lab Results  Component Value Date   CHOLHDL 3.5 10/21/2019   Lab Results  Component Value Date   HGBA1C 5.6 10/21/2019      Assessment & Plan:   Problem List Items Addressed This Visit   None   Visit Diagnoses    Screening for endocrine, metabolic and immunity disorder    -  Primary   Relevant Orders   CBC With Differential   Comprehensive metabolic panel   Hemoglobin A1c   TSH   Urinalysis   Lipid screening       Relevant Orders   Lipid panel   Screen for colon cancer       Relevant Orders   Cologuard   Essential hypertension       Relevant Medications   losartan (COZAAR) 100 MG tablet   amLODipine (NORVASC) 10 MG tablet      Meds ordered this encounter  Medications  . losartan (COZAAR) 100 MG tablet    Sig: Take 1 tablet (100 mg total) by mouth daily.    Dispense:  90 tablet    Refill:  1    Order Specific Question:   Supervising Provider    Answer:   Neva Seat, JEFFREY R [[id number]]  . amLODipine (NORVASC) 10 MG tablet    Sig: Take 1 tablet (10 mg total) by mouth daily.    Dispense:  90 tablet    Refill:  3    Order Specific Question:   Supervising Provider    Answer:   Neva Seat, JEFFREY R [[id number]]     Follow-up: No follow-ups on file.   PLAN  Exam unremarkable  Labs collected. Will follow up with the patient as warranted.  Return in 6 mo for check on htn  Patient encouraged to call clinic with any questions, comments, or concerns.  Janeece Agee, NP

## 2020-11-26 NOTE — Patient Instructions (Signed)
° ° ° °  If you have lab work done today you will be contacted with your lab results within the next 2 weeks.  If you have not heard from us then please contact us. The fastest way to get your results is to register for My Chart. ° ° °IF you received an x-ray today, you will receive an invoice from [facility]. Please contact [facility] at [phone] with questions or concerns regarding your invoice.  ° °IF you received labwork today, you will receive an invoice from [facility]. Please contact [facility] at [phone] with questions or concerns regarding your invoice.  ° °Our billing staff will not be able to assist you with questions regarding bills from these companies. ° °You will be contacted with the lab results as soon as they are available. The fastest way to get your results is to activate your My Chart account. Instructions are located on the last page of this paperwork. If you have not heard from us regarding the results in 2 weeks, please contact this office. °  ° ° ° °

## 2020-11-27 ENCOUNTER — Other Ambulatory Visit: Payer: Self-pay | Admitting: Registered Nurse

## 2020-11-27 DIAGNOSIS — M7662 Achilles tendinitis, left leg: Secondary | ICD-10-CM

## 2020-11-27 LAB — URINALYSIS
Bilirubin, UA: NEGATIVE
Glucose, UA: NEGATIVE
Ketones, UA: NEGATIVE
Leukocytes,UA: NEGATIVE
Nitrite, UA: NEGATIVE
Protein,UA: NEGATIVE
RBC, UA: NEGATIVE
Specific Gravity, UA: 1.02 (ref 1.005–1.030)
Urobilinogen, Ur: 0.2 mg/dL (ref 0.2–1.0)
pH, UA: 5.5 (ref 5.0–7.5)

## 2020-11-27 LAB — LIPID PANEL
Chol/HDL Ratio: 3.6 ratio (ref 0.0–4.4)
Cholesterol, Total: 186 mg/dL (ref 100–199)
HDL: 51 mg/dL (ref >39)
LDL Chol Calc (NIH): 117 mg/dL — ABNORMAL HIGH (ref 0–99)
Triglycerides: 97 mg/dL (ref 0–149)
VLDL Cholesterol Cal: 18 mg/dL (ref 5–40)

## 2020-11-27 LAB — COMPREHENSIVE METABOLIC PANEL
ALT: 21 IU/L (ref 0–32)
AST: 23 IU/L (ref 0–40)
Albumin/Globulin Ratio: 1.2 (ref 1.2–2.2)
Albumin: 4.3 g/dL (ref 3.8–4.9)
Alkaline Phosphatase: 107 IU/L (ref 44–121)
BUN/Creatinine Ratio: 16 (ref 9–23)
BUN: 15 mg/dL (ref 6–24)
Bilirubin Total: 0.4 mg/dL (ref 0.0–1.2)
CO2: 22 mmol/L (ref 20–29)
Calcium: 9.4 mg/dL (ref 8.7–10.2)
Chloride: 102 mmol/L (ref 96–106)
Creatinine, Ser: 0.96 mg/dL (ref 0.57–1.00)
GFR calc Af Amer: 79 mL/min/{1.73_m2} (ref >59)
GFR calc non Af Amer: 69 mL/min/{1.73_m2} (ref >59)
Globulin, Total: 3.7 g/dL (ref 1.5–4.5)
Glucose: 90 mg/dL (ref 65–99)
Potassium: 3.9 mmol/L (ref 3.5–5.2)
Sodium: 143 mmol/L (ref 134–144)
Total Protein: 8 g/dL (ref 6.0–8.5)

## 2020-11-27 LAB — CBC WITH DIFFERENTIAL
Basophils Absolute: 0 10*3/uL (ref 0.0–0.2)
Basos: 0 %
EOS (ABSOLUTE): 0.1 10*3/uL (ref 0.0–0.4)
Eos: 2 %
Hematocrit: 44.4 % (ref 34.0–46.6)
Hemoglobin: 14.3 g/dL (ref 11.1–15.9)
Immature Grans (Abs): 0 10*3/uL (ref 0.0–0.1)
Immature Granulocytes: 0 %
Lymphocytes Absolute: 1.9 10*3/uL (ref 0.7–3.1)
Lymphs: 28 %
MCH: 26.8 pg (ref 26.6–33.0)
MCHC: 32.2 g/dL (ref 31.5–35.7)
MCV: 83 fL (ref 79–97)
Monocytes Absolute: 0.5 10*3/uL (ref 0.1–0.9)
Monocytes: 7 %
Neutrophils Absolute: 4.2 10*3/uL (ref 1.4–7.0)
Neutrophils: 63 %
RBC: 5.33 x10E6/uL — ABNORMAL HIGH (ref 3.77–5.28)
RDW: 13.7 % (ref 11.7–15.4)
WBC: 6.7 10*3/uL (ref 3.4–10.8)

## 2020-11-27 LAB — TSH: TSH: 0.674 u[IU]/mL (ref 0.450–4.500)

## 2020-11-27 LAB — HEMOGLOBIN A1C
Est. average glucose Bld gHb Est-mCnc: 126 mg/dL
Hgb A1c MFr Bld: 6 % — ABNORMAL HIGH (ref 4.8–5.6)

## 2020-11-27 NOTE — Telephone Encounter (Signed)
Requested Prescriptions  Pending Prescriptions Disp Refills  . meloxicam (MOBIC) 7.5 MG tablet [Pharmacy Med Name: MELOXICAM 7.5 MG TABLET] 30 tablet 0    Sig: TAKE 1 TABLET BY MOUTH EVERY DAY     Analgesics:  COX2 Inhibitors Passed - 11/27/2020 12:07 AM      Passed - HGB in normal range and within 360 days    Hemoglobin  Date Value Ref Range Status  12/10/2019 13.9 12.0 - 15.0 g/dL Final  88/82/8003 49.1 11.1 - 15.9 g/dL Final         Passed - Cr in normal range and within 360 days    Creatinine, Ser  Date Value Ref Range Status  12/12/2019 0.89 0.57 - 1.00 mg/dL Final         Passed - Patient is not pregnant      Passed - Valid encounter within last 12 months    Recent Outpatient Visits          Yesterday Screening for endocrine, metabolic and immunity disorder   [facility], Gerlene Burdock, NP   11 months ago Elevated serum creatinine   [facility], Gerlene Burdock, NP   1 year ago Essential hypertension   [facility], Gerlene Burdock, NP   1 year ago Essential hypertension   [facility], Gerlene Burdock, NP   8 years ago Hives   [facility], Harrel Lemon, MD

## 2020-12-22 ENCOUNTER — Other Ambulatory Visit: Payer: Self-pay | Admitting: Registered Nurse

## 2020-12-22 DIAGNOSIS — M7662 Achilles tendinitis, left leg: Secondary | ICD-10-CM

## 2020-12-22 NOTE — Telephone Encounter (Signed)
Requested medications are due for refill today yes  Requested medications are on the active medication list yes  Last refill 11/27/20  Last visit Seen 11/26/20, not mentioned, was addressed in visit 11/2019  Future visit scheduled No, is supposed to return in Aug no visit scheduled.  Notes to clinic Was given 30 recently for heel injury received in Feb 2021. Please assess.

## 2020-12-24 LAB — COLOGUARD: Cologuard: NEGATIVE

## 2021-07-09 ENCOUNTER — Other Ambulatory Visit: Payer: Self-pay | Admitting: Family Medicine

## 2021-07-09 ENCOUNTER — Ambulatory Visit
Admission: RE | Admit: 2021-07-09 | Discharge: 2021-07-09 | Disposition: A | Discharge status: Home / Self Care | Payer: BC Managed Care – PPO | Source: Ambulatory Visit | Attending: Family Medicine | Admitting: Family Medicine

## 2021-07-09 DIAGNOSIS — M79672 Pain in left foot: Secondary | ICD-10-CM

## 2021-08-08 ENCOUNTER — Other Ambulatory Visit: Payer: Self-pay | Admitting: Registered Nurse

## 2021-08-08 DIAGNOSIS — I10 Essential (primary) hypertension: Secondary | ICD-10-CM

## 2021-09-01 ENCOUNTER — Other Ambulatory Visit: Payer: Self-pay | Admitting: Registered Nurse

## 2021-09-01 DIAGNOSIS — I10 Essential (primary) hypertension: Secondary | ICD-10-CM

## 2021-09-15 ENCOUNTER — Other Ambulatory Visit: Payer: Self-pay | Admitting: Registered Nurse

## 2021-09-15 DIAGNOSIS — I10 Essential (primary) hypertension: Secondary | ICD-10-CM

## 2021-10-24 ENCOUNTER — Other Ambulatory Visit: Payer: Self-pay | Admitting: Registered Nurse

## 2021-10-24 DIAGNOSIS — Z1231 Encounter for screening mammogram for malignant neoplasm of breast: Secondary | ICD-10-CM

## 2021-11-12 ENCOUNTER — Other Ambulatory Visit: Payer: Self-pay

## 2021-11-12 ENCOUNTER — Ambulatory Visit
Admission: RE | Admit: 2021-11-12 | Discharge: 2021-11-12 | Disposition: A | Discharge status: Home / Self Care | Payer: BC Managed Care – PPO | Source: Ambulatory Visit | Attending: Registered Nurse | Admitting: Registered Nurse

## 2021-11-12 DIAGNOSIS — Z1231 Encounter for screening mammogram for malignant neoplasm of breast: Secondary | ICD-10-CM

## 2021-12-25 ENCOUNTER — Other Ambulatory Visit: Payer: Self-pay | Admitting: Registered Nurse

## 2021-12-25 DIAGNOSIS — I10 Essential (primary) hypertension: Secondary | ICD-10-CM

## 2023-05-22 IMAGING — MG MM DIGITAL SCREENING BILAT W/ TOMO AND CAD
8 of 14 series · 8 of 40 positions shown · non-contrast
Comparison: Previous exam(s).

CLINICAL DATA: Screening.

EXAM:
DIGITAL SCREENING BILATERAL MAMMOGRAM WITH TOMOSYNTHESIS AND CAD
TECHNIQUE: Bilateral screening digital craniocaudal and mediolateral oblique
mammograms were obtained. Bilateral screening digital breast
tomosynthesis was performed. The images were evaluated with
computer-aided detection.

[L MLO synth-2D (1 of 2)]
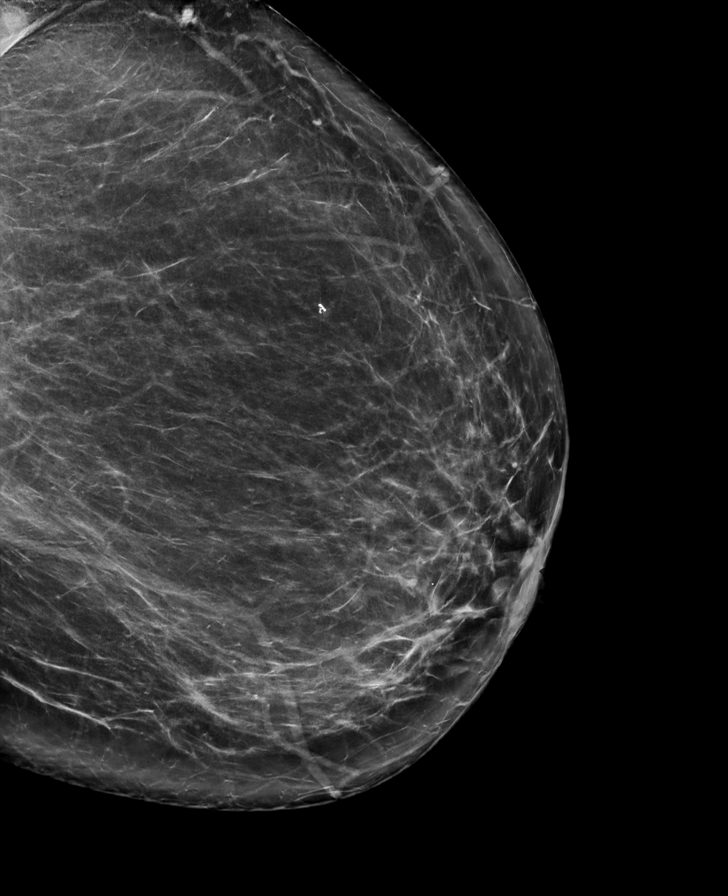

[R MLO synth-2D (1 of 2)]
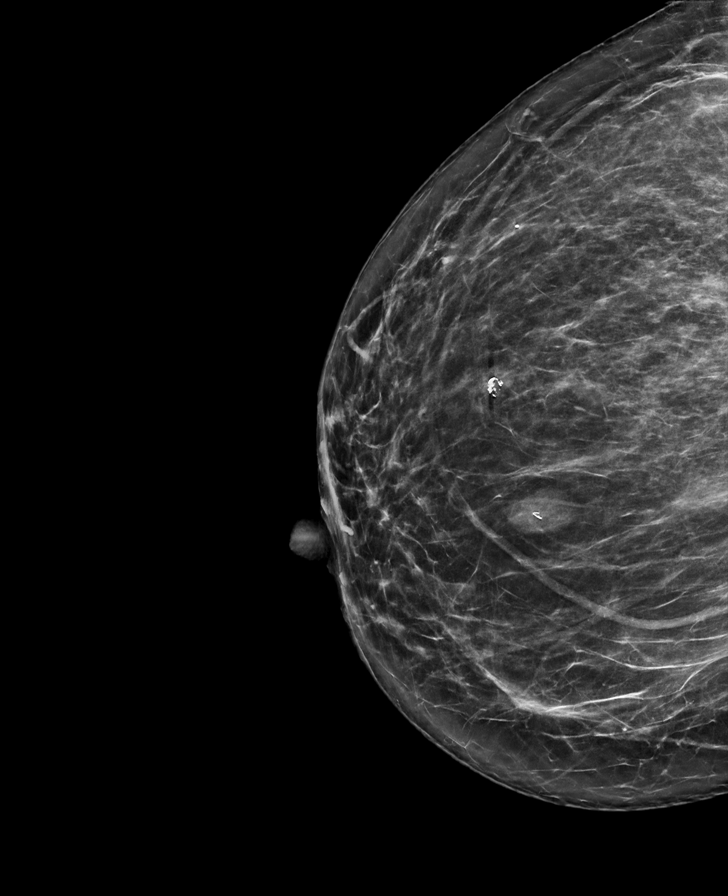

[R CC synth-2D]
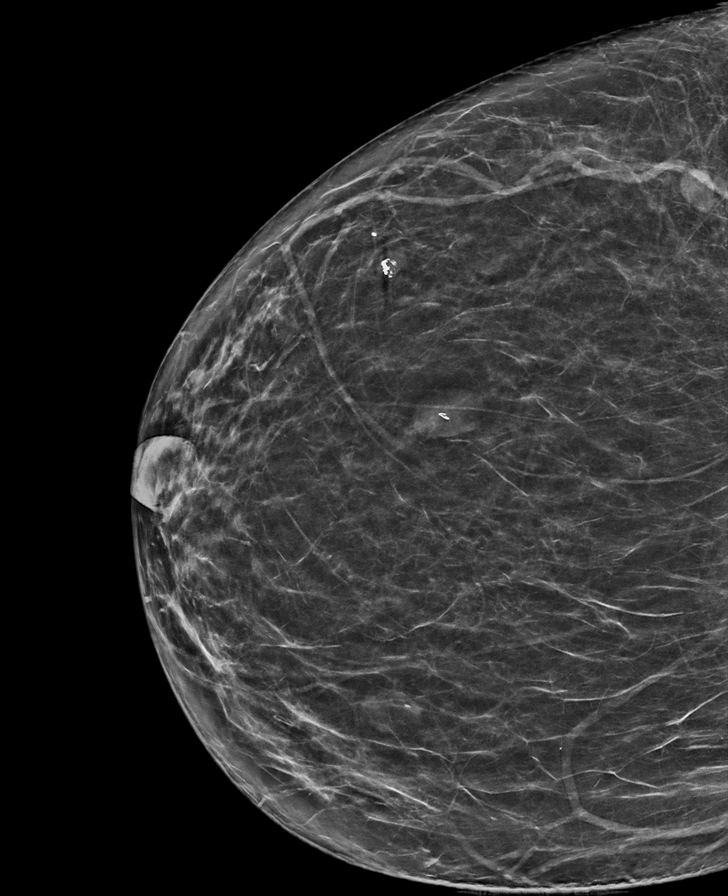

[L CC synth-2D]
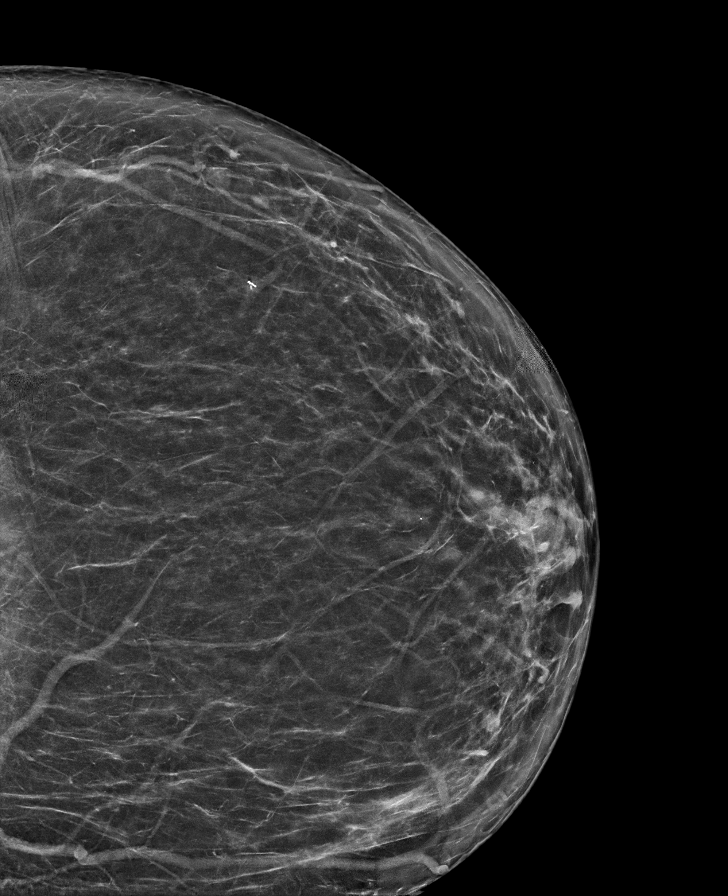

[L MLO synth-2D (2 of 2)]
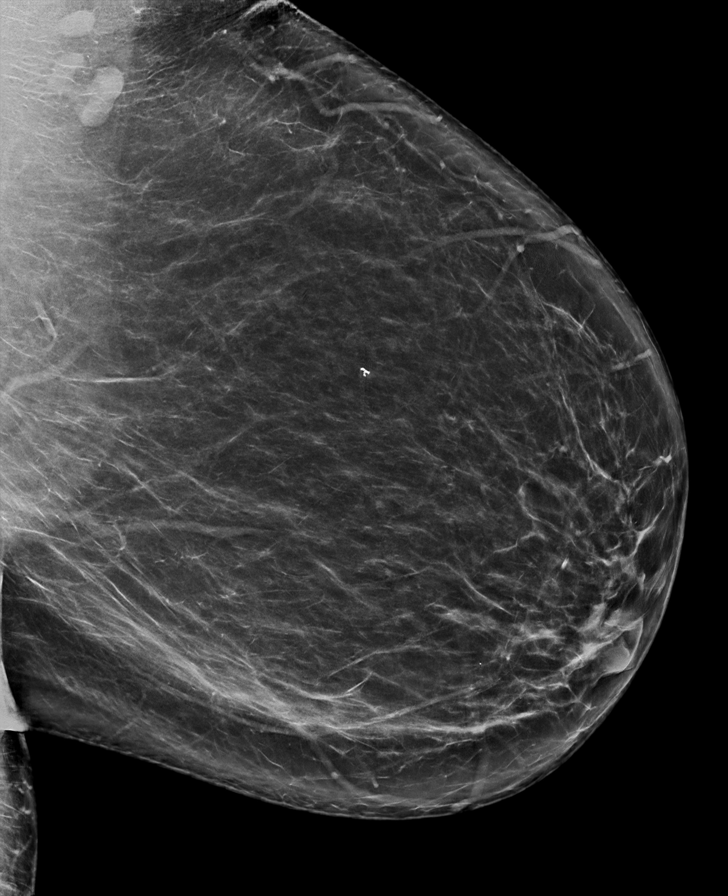

[L CV synth-2D]
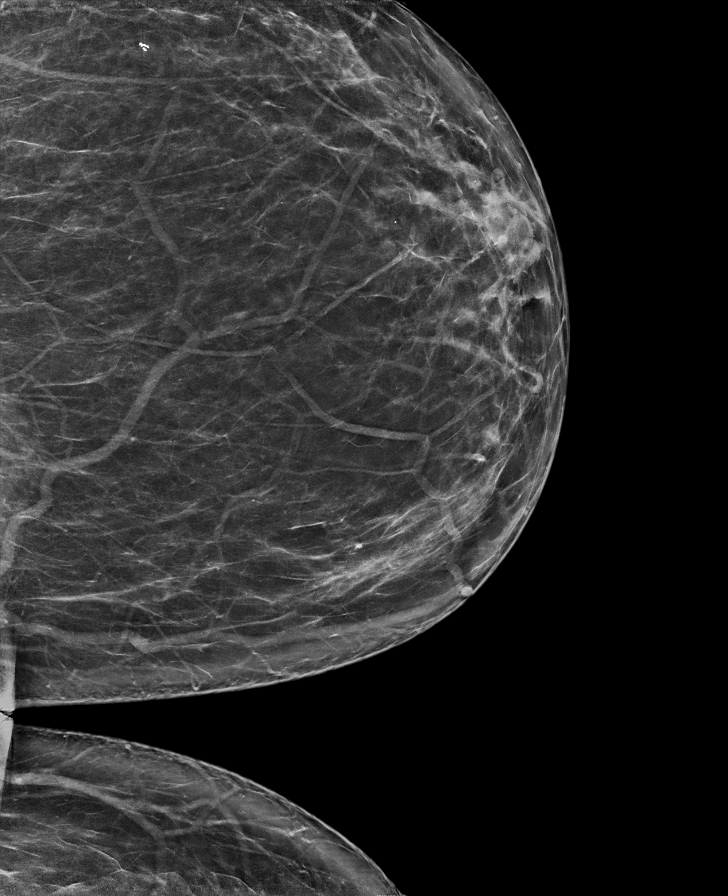

[R MLO synth-2D (2 of 2)]
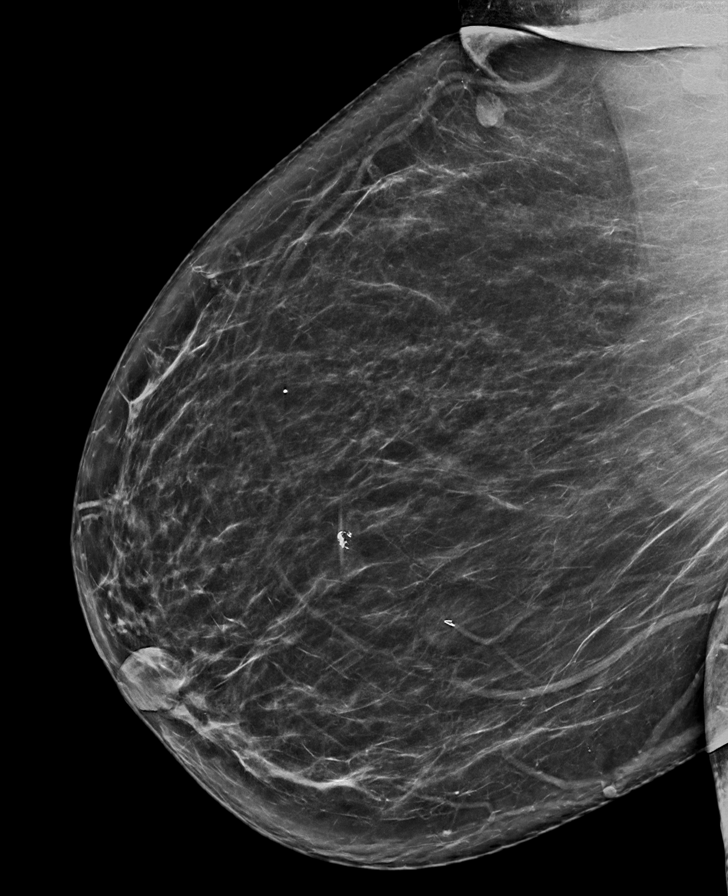

[L MLO tomo · tomo slice 48/95.0]
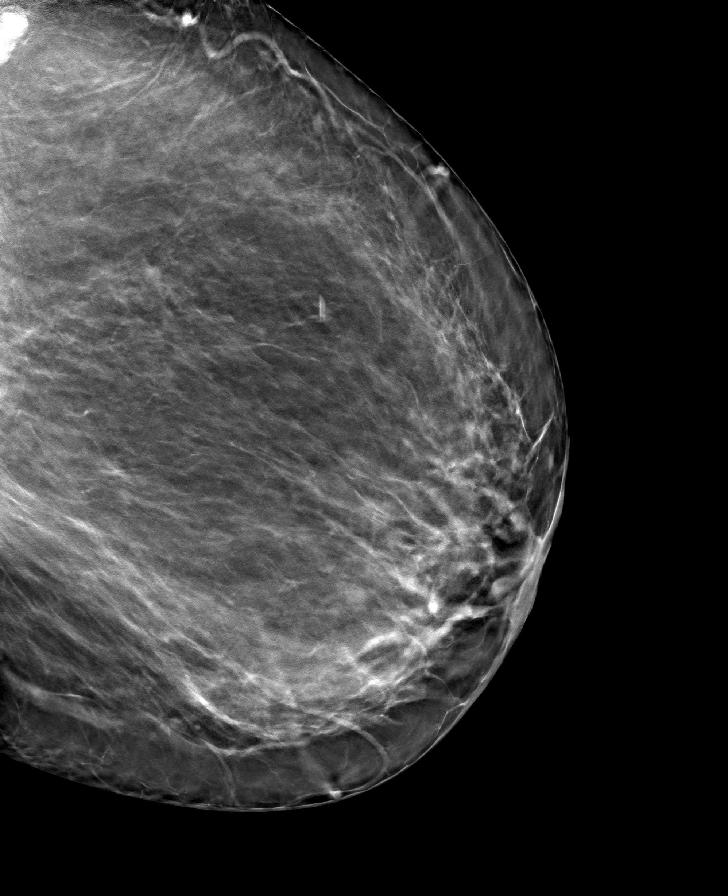

[8 of 40 positions shown; findings below may reference images not displayed]

ACR Breast Density Category b: There are scattered areas of
fibroglandular density.
FINDINGS: There are no findings suspicious for malignancy.
IMPRESSION: No mammographic evidence of malignancy. A result letter of this
screening mammogram will be mailed directly to the patient.

RECOMMENDATION:
Screening mammogram in one year. (Code:51-O-LD2)

BI-RADS CATEGORY  1: Negative.

## 2023-08-13 ENCOUNTER — Encounter (HOSPITAL_COMMUNITY): Payer: Self-pay

## 2023-08-13 ENCOUNTER — Emergency Department (HOSPITAL_COMMUNITY)
Admission: EM | Admit: 2023-08-13 | Discharge: 2023-08-13 | Disposition: A | Discharge status: Home / Self Care | Payer: BC Managed Care – PPO | Attending: Emergency Medicine | Admitting: Emergency Medicine

## 2023-08-13 ENCOUNTER — Other Ambulatory Visit: Payer: Self-pay

## 2023-08-13 DIAGNOSIS — R350 Frequency of micturition: Secondary | ICD-10-CM | POA: Diagnosis present

## 2023-08-13 DIAGNOSIS — Z79899 Other long term (current) drug therapy: Secondary | ICD-10-CM | POA: Diagnosis not present

## 2023-08-13 DIAGNOSIS — I10 Essential (primary) hypertension: Secondary | ICD-10-CM | POA: Diagnosis not present

## 2023-08-13 LAB — CBC WITH DIFFERENTIAL/PLATELET
Abs Immature Granulocytes: 0.01 10*3/uL (ref 0.00–0.07)
Basophils Absolute: 0 10*3/uL (ref 0.0–0.1)
Basophils Relative: 0 %
Eosinophils Absolute: 0 10*3/uL (ref 0.0–0.5)
Eosinophils Relative: 0 %
HCT: 46.4 % — ABNORMAL HIGH (ref 36.0–46.0)
Hemoglobin: 14.7 g/dL (ref 12.0–15.0)
Immature Granulocytes: 0 %
Lymphocytes Relative: 27 %
Lymphs Abs: 2.4 10*3/uL (ref 0.7–4.0)
MCH: 26.3 pg (ref 26.0–34.0)
MCHC: 31.7 g/dL (ref 30.0–36.0)
MCV: 83 fL (ref 80.0–100.0)
Monocytes Absolute: 0.6 10*3/uL (ref 0.1–1.0)
Monocytes Relative: 6 %
Neutro Abs: 6 10*3/uL (ref 1.7–7.7)
Neutrophils Relative %: 67 %
Platelets: 191 10*3/uL (ref 150–400)
RBC: 5.59 MIL/uL — ABNORMAL HIGH (ref 3.87–5.11)
RDW: 13.3 % (ref 11.5–15.5)
WBC: 9 10*3/uL (ref 4.0–10.5)
nRBC: 0 % (ref 0.0–0.2)

## 2023-08-13 LAB — BASIC METABOLIC PANEL
Anion gap: 10 (ref 5–15)
BUN: 8 mg/dL (ref 6–20)
CO2: 25 mmol/L (ref 22–32)
Calcium: 9.2 mg/dL (ref 8.9–10.3)
Chloride: 104 mmol/L (ref 98–111)
Creatinine, Ser: 1.06 mg/dL — ABNORMAL HIGH (ref 0.44–1.00)
GFR, Estimated: >60 mL/min (ref >60)
Glucose, Bld: 98 mg/dL (ref 70–99)
Potassium: 3.5 mmol/L (ref 3.5–5.1)
Sodium: 139 mmol/L (ref 135–145)

## 2023-08-13 LAB — URINALYSIS, ROUTINE W REFLEX MICROSCOPIC

## 2023-08-13 NOTE — Discharge Instructions (Addendum)
You have been seen today for your complaint of high blood pressure. Your lab work was reassuring. Your discharge medications include your home medications.  Continue taking them as prescribed. Home care instructions are as follows:  Follow a low-sodium diet Follow up with: Your primary care provider in 1 week for reevaluation of your symptoms Please seek immediate medical care if you develop any of the following symptoms: Develop a severe headache or confusion. Have unusual weakness or numbness. Feel faint. Have severe pain in your chest or abdomen. Vomit repeatedly. Have trouble breathing. At this time there does not appear to be the presence of an emergent medical condition, however there is always the potential for conditions to change. Please read and follow the below instructions.  Do not take your medicine if  develop an itchy rash, swelling in your mouth or lips, or difficulty breathing; call 911 and seek immediate emergency medical attention if this occurs.  You may review your lab tests and imaging results in their entirety on your MyChart account.  Please discuss all results of fully with your primary care provider and other specialist at your follow-up visit.  Note: Portions of this text may have been transcribed using voice recognition software. Every effort was made to ensure accuracy; however, inadvertent computerized transcription errors may still be present.

## 2023-08-13 NOTE — ED Triage Notes (Signed)
Pt took Bps for the last two days and SBP was 201. Pt has been taking BP meds but Bp continues to fluctuate. Pt also c/o headache 4/10 that began when Bp became high. Denies CP SOB.

## 2023-08-13 NOTE — ED Provider Notes (Signed)
[place] EMERGENCY DEPARTMENT AT [facility] Provider Note   CSN: [id number] Arrival date & time: 08/13/23  1715     History  Chief Complaint  Patient presents with   Hypertension    Kelly Hickman is a 52 y.o. female.  With history of hypertension presenting to the ED for evaluation of hypertension.  She states her blood pressure has been higher than normal over the last 2 days.  She states she checked her blood pressure at work yesterday and it was over 200 systolic.  She then sat down and was still in the 190s systolic.  She went to her friend's house after work and checked it again and found to be in the 180s systolic.  She was encouraged to present to the emergency department today by her friend.  She reports compliance with her antihypertensives.  She states her elevated blood pressure may be due to dietary changes as she had some [place] fries prior to elevated readings.  She is currently complaining of some urinary frequency but denies headache, numbness, weakness, tingling, chest pain, shortness of breath, abdominal pain, nausea, vomiting.  She reports some blurred vision when she takes her glasses off but states that her vision is normal when she is wearing her glasses.   Hypertension       Home Medications Prior to Admission medications   Medication Sig Start Date End Date Taking? Authorizing Provider  amLODipine (NORVASC) 10 MG tablet TAKE 1 TABLET BY MOUTH EVERY DAY 12/25/21   Janeece Agee, NP  losartan (COZAAR) 100 MG tablet TAKE 1 TABLET BY MOUTH EVERY DAY 09/16/21   Janeece Agee, NP  meloxicam (MOBIC) 7.5 MG tablet TAKE 1 TABLET BY MOUTH EVERY DAY 12/24/20   Janeece Agee, NP  sulfamethoxazole-trimethoprim (BACTRIM DS) 800-160 MG tablet Take 1 tablet by mouth 2 (two) times daily. 12/12/19   Janeece Agee, NP      Allergies    Patient has no known allergies.    Review of Systems   Review of Systems  Genitourinary:  Positive for frequency.  All  other systems reviewed and are negative.   Physical Exam Updated Vital Signs BP (!) 164/83   Pulse 74   Temp 98.7 F (37.1 C) (Oral)   Resp 19   Ht 5\' 8"  (1.727 m)   Wt 110.7 kg   LMP 10/21/2019   SpO2 94%   BMI 37.10 kg/m  Physical Exam Vitals and nursing note reviewed.  Constitutional:      General: She is not in acute distress.    Appearance: She is well-developed. She is not ill-appearing, toxic-appearing or diaphoretic.     Comments: Resting comfortably in bed  HENT:     Head: Normocephalic and atraumatic.  Eyes:     Conjunctiva/sclera: Conjunctivae normal.  Cardiovascular:     Rate and Rhythm: Normal rate and regular rhythm.     Heart sounds: No murmur heard. Pulmonary:     Effort: Pulmonary effort is normal. No respiratory distress.     Breath sounds: Normal breath sounds.  Abdominal:     Palpations: Abdomen is soft.     Tenderness: There is no abdominal tenderness.  Musculoskeletal:        General: No swelling.     Cervical back: Neck supple.     Right lower leg: No edema.     Left lower leg: No edema.  Skin:    General: Skin is warm and dry.     Capillary Refill: Capillary refill  takes less than 2 seconds.  Neurological:     General: No focal deficit present.     Mental Status: She is alert and oriented to person, place, and time.  Psychiatric:        Mood and Affect: Mood normal.     ED Results / Procedures / Treatments   Labs (all labs ordered are listed, but only abnormal results are displayed) Labs Reviewed  BASIC METABOLIC PANEL - Abnormal; Notable for the following components:      Result Value   Creatinine, Ser 1.06 (*)    All other components within normal limits  CBC WITH DIFFERENTIAL/PLATELET - Abnormal; Notable for the following components:   RBC 5.59 (*)    HCT 46.4 (*)    All other components within normal limits  URINALYSIS, ROUTINE W REFLEX MICROSCOPIC - Abnormal; Notable for the following components:   Color, Urine AMBER (*)     APPearance HAZY (*)    Ketones, ur 20 (*)    Protein, ur 30 (*)    Bacteria, UA RARE (*)    All other components within normal limits    EKG EKG Interpretation Date/Time:  Thursday August 13 2023 20:27:15 EST Ventricular Rate:  83 PR Interval:  137 QRS Duration:  91 QT Interval:  413 QTC Calculation: 486 R Axis:   15  Text Interpretation: Sinus rhythm No significant change since last tracing Confirmed by Cathren Laine ([id number]) on 08/13/2023 9:26:04 PM  Radiology No results found.  Procedures Procedures    Medications Ordered in ED Medications - No data to display  ED Course/ Medical Decision Making/ A&P                                 Medical Decision Making Amount and/or Complexity of Data Reviewed Labs: ordered.  This patient presents to the ED for concern of hypertension, this involves an extensive number of treatment options, and is a complaint that carries with it a high risk of complications and morbidity. The differential diagnosis for hypertension includes but is not limited to hypertensive emergency, hypertensive urgency, stroke, sympathomimetic ingestion, acute pulmonary edema, ischemic stroke, intracranial hemorrhage, preeclampsia or eclampsia, autonomic dysreflexia, acute glomerulonephritis, type I MI, volume overload, urinary obstruction, pain, renal artery stenosis, polycystic kidney disease, Cushing syndrome, OSA, pheochromocytoma, hyperaldosteronism, hypothyroidism, anxiety   My initial workup includes basic labs, EKG  Additional history obtained from: Nursing notes from this visit.  I ordered, reviewed and interpreted labs which include: BMP, CBC, urinalysis  Afebrile, initially hypertensive to the 170s systolic over 100 diastolic with improvement to 140s over 90s without intervention but otherwise hemodynamically stable.  52 year old female presenting to the ED for evaluation of hypertension.  Her only symptom is urinary frequency.  She is otherwise  asymptomatic.  Labs were obtained to evaluate for endorgan dysfunction.  These labs are normal.  No evidence of hypertensive emergency here today.  She was encouraged to follow-up with her primary care provider in 1 week for reevaluation of her symptoms.  She was given return precautions.  Stable at discharge.  At this time there does not appear to be any evidence of an acute emergency medical condition and the patient appears stable for discharge with appropriate outpatient follow up. Diagnosis was discussed with patient who verbalizes understanding of care plan and is agreeable to discharge. I have discussed return precautions with patient who verbalizes understanding. Patient encouraged to follow-up with their PCP  within 1 week. All questions answered.  Note: Portions of this report may have been transcribed using voice recognition software. Every effort was made to ensure accuracy; however, inadvertent computerized transcription errors may still be present.        Final Clinical Impression(s) / ED Diagnoses Final diagnoses:  Uncontrolled hypertension    Rx / DC Orders ED Discharge Orders     None         Mora Bellman 08/13/23 2134    Cathren Laine, MD 08/15/23 1429

## 2024-04-12 ENCOUNTER — Emergency Department (HOSPITAL_COMMUNITY): Payer: Worker's Compensation

## 2024-04-12 ENCOUNTER — Emergency Department (HOSPITAL_COMMUNITY)
Admission: EM | Admit: 2024-04-12 | Discharge: 2024-04-12 | Disposition: A | Discharge status: Home / Self Care | Payer: Worker's Compensation | Attending: Emergency Medicine | Admitting: Emergency Medicine

## 2024-04-12 ENCOUNTER — Other Ambulatory Visit: Payer: Self-pay

## 2024-04-12 ENCOUNTER — Encounter (HOSPITAL_COMMUNITY): Payer: Self-pay

## 2024-04-12 DIAGNOSIS — Z23 Encounter for immunization: Secondary | ICD-10-CM | POA: Diagnosis not present

## 2024-04-12 DIAGNOSIS — W19XXXA Unspecified fall, initial encounter: Secondary | ICD-10-CM

## 2024-04-12 DIAGNOSIS — Y990 Civilian activity done for income or pay: Secondary | ICD-10-CM | POA: Insufficient documentation

## 2024-04-12 DIAGNOSIS — S61214A Laceration without foreign body of right ring finger without damage to nail, initial encounter: Secondary | ICD-10-CM | POA: Diagnosis not present

## 2024-04-12 DIAGNOSIS — S6991XA Unspecified injury of right wrist, hand and finger(s), initial encounter: Secondary | ICD-10-CM | POA: Diagnosis present

## 2024-04-12 DIAGNOSIS — W1789XA Other fall from one level to another, initial encounter: Secondary | ICD-10-CM | POA: Diagnosis not present

## 2024-04-12 DIAGNOSIS — S61216A Laceration without foreign body of right little finger without damage to nail, initial encounter: Secondary | ICD-10-CM

## 2024-04-12 MED ORDER — LIDOCAINE-EPINEPHRINE (PF) 2 %-1:200000 IJ SOLN
10.0000 mL | Freq: Once | INTRAMUSCULAR | Status: AC
  Administered 2024-04-12: 10 mL
  Filled 2024-04-12: qty 20

## 2024-04-12 MED ORDER — TETANUS-DIPHTH-ACELL PERTUSSIS 5-2.5-18.5 LF-MCG/0.5 IM SUSY
0.5000 mL | PREFILLED_SYRINGE | Freq: Once | INTRAMUSCULAR | Status: AC
  Administered 2024-04-12: 0.5 mL via INTRAMUSCULAR
  Filled 2024-04-12: qty 0.5

## 2024-04-12 MED ORDER — OXYCODONE-ACETAMINOPHEN 5-325 MG PO TABS
2.0000 | ORAL_TABLET | Freq: Once | ORAL | Status: AC
  Administered 2024-04-12: 2 via ORAL
  Filled 2024-04-12: qty 2

## 2024-04-12 NOTE — ED Provider Notes (Signed)
 [place] EMERGENCY DEPARTMENT AT [facility] Provider Note   CSN: [id number] Arrival date & time: 04/12/24  9164     Patient presents with: Fall 19ft to Rt side   Karliah Kowalchuk is a 53 y.o. female.  {Add pertinent medical, surgical, social history, OB history to HPI:3630} HPI 53 year old female who fell off of a lift loading a box.  She injured her right fourth finger.  She states that she had some back pain at that time.  She currently does not have any back pain.  She did not strike her head, lose consciousness, is not on blood thinners, does not have lateralized deficits.  Unsure when last tetanus shot was.    Prior to Admission medications   Medication Sig Start Date End Date Taking? Authorizing Provider  amLODipine  (NORVASC ) 10 MG tablet TAKE 1 TABLET BY MOUTH EVERY DAY 12/25/21   Kip Ade, NP  losartan  (COZAAR ) 100 MG tablet TAKE 1 TABLET BY MOUTH EVERY DAY 09/16/21   Kip Ade, NP  meloxicam  (MOBIC ) 7.5 MG tablet TAKE 1 TABLET BY MOUTH EVERY DAY 12/24/20   Kip Ade, NP  sulfamethoxazole -trimethoprim  (BACTRIM  DS) 800-160 MG tablet Take 1 tablet by mouth 2 (two) times daily. 12/12/19   Kip Ade, NP    Allergies: Patient has no known allergies.    Review of Systems  Updated Vital Signs BP (!) 198/89   Pulse 62   Temp 98.1 F (36.7 C) (Oral)   Resp 12   Ht 1.702 m (5' 7)   Wt 113.9 kg   LMP 10/21/2019   SpO2 100%   BMI 39.31 kg/m   Physical Exam Vitals and nursing note reviewed.  HENT:     Head: Normocephalic and atraumatic.     Right Ear: External ear normal.     Left Ear: External ear normal.     Nose: Nose normal.     Mouth/Throat:     Mouth: Mucous membranes are moist.  Eyes:     Extraocular Movements: Extraocular movements intact.     Pupils: Pupils are equal, round, and reactive to light.  Cardiovascular:     Rate and Rhythm: Normal rate and regular rhythm.     Pulses: Normal pulses.  Pulmonary:     Effort:  Pulmonary effort is normal.     Breath sounds: Normal breath sounds.  Abdominal:     General: Bowel sounds are normal.     Palpations: Abdomen is soft.  Musculoskeletal:     Cervical back: Normal range of motion and neck supple. No tenderness.     Comments: Right ring finger with laceration and some flexion deformity. Reexamined after x-Kanyah Matsushima and has full active range of motion sensation intact  Skin:    General: Skin is warm and dry.     Capillary Refill: Capillary refill takes less than 2 seconds.  Neurological:     General: No focal deficit present.     Mental Status: She is alert.  Psychiatric:        Mood and Affect: Mood normal.     (all labs ordered are listed, but only abnormal results are displayed) Labs Reviewed - No data to display  EKG: None  Radiology: DG Hand Complete Right Result Date: 04/12/2024 CLINICAL DATA:  Pain in the fourth finger EXAM: RIGHT HAND - COMPLETE 3+ VIEW COMPARISON:  None Available. FINDINGS: No evidence of fracture of the carpal or metacarpal bones. Radiocarpal joint is intact. Phalanges are normal. No soft tissue injury. IMPRESSION: No  fracture or dislocation. Electronically Signed   By: Jackquline Boxer M.D.   On: 04/12/2024 10:13    {Document cardiac monitor, telemetry assessment procedure when appropriate:32947} Procedures   Medications Ordered in the ED  lidocaine -EPINEPHrine  (XYLOCAINE  W/EPI) 2 %-1:200000 (PF) injection 10 mL (has no administration in time range)  Tdap (BOOSTRIX ) injection 0.5 mL (0.5 mLs Intramuscular Given 04/12/24 0937)  oxyCODONE -acetaminophen  (PERCOCET/ROXICET) 5-325 MG per tablet 2 tablet (2 tablets Oral Given 04/12/24 0936)      {Click here for ABCD2, HEART and other calculators REFRESH Note before signing:1}                              Medical Decision Making Amount and/or Complexity of Data Reviewed Radiology: ordered.  Risk Prescription drug management.   X-rays obtained no evidence of fracture on  finger   {Document critical care time when appropriate  Document review of labs and clinical decision tools ie CHADS2VASC2, etc  Document your independent review of radiology images and any outside records  Document your discussion with family members, caretakers and with consultants  Document social determinants of health affecting pt's care  Document your decision making why or why not admission, treatments were needed:32947:::1}   Final diagnoses:  None    ED Discharge Orders     None

## 2024-04-12 NOTE — Discharge Instructions (Signed)
 Please keep splint in place.  Examine finger 1-2 times per day.  Recheck if you are having increased pain, redness, or swelling.  Recheck with your doctor for suture removal in 7 to 10 days.

## 2024-04-12 NOTE — ED Triage Notes (Signed)
 Pt BIB [facility] from work where she fell back off a w/c lift helping a pt at her work & landing on pavement on her Lt side. The fall was approx. 4 ft high, Denies LOC,or any head/neck injury/pain. EMS reports an avulsion to a finger on Rt hand from bracing her fall & ring cutting into finger. Pt is very anxious & Hx of HTN with her v/s are 178/126, 100 bpm, 98% on RA, CBG 109.

## 2024-04-12 NOTE — ED Provider Notes (Signed)
.  Laceration Repair  Date/Time: 04/12/2024 11:25 AM  Performed by: Theotis Cameron HERO, PA-C Authorized by: Theotis Cameron HERO, PA-C   Consent:    Consent obtained:  Verbal   Consent given by:  Patient   Risks, benefits, and alternatives were discussed: yes     Risks discussed:  Infection Universal protocol:    Procedure explained and questions answered to patient or proxy's satisfaction: yes     Relevant documents present and verified: yes     Test results available: yes     Imaging studies available: yes     Required blood products, implants, devices, and special equipment available: yes     Site/side marked: yes     Immediately prior to procedure, a time out was called: yes     Patient identity confirmed:  Verbally with patient and arm band Anesthesia:    Anesthesia method:  Local infiltration   Local anesthetic:  Lidocaine  2% WITH epi Laceration details:    Location:  Finger   Finger location:  R ring finger   Length (cm):  4 Pre-procedure details:    Preparation:  Patient was prepped and draped in usual sterile fashion Exploration:    Hemostasis achieved with:  Epinephrine    Imaging obtained: x-ray     Imaging outcome: foreign body not noted     Wound exploration: wound explored through full range of motion     Wound extent: areolar tissue violated     Wound extent: fascia not violated, no foreign body, no signs of injury, no nerve damage, no tendon damage, no underlying fracture and no vascular damage   Treatment:    Area cleansed with:  Povidone-iodine   Amount of cleaning:  Extensive   Debridement:  None   Undermining:  None Skin repair:    Repair method:  Sutures   Suture size:  4-0   Suture material:  Nylon   Suture technique:  Simple interrupted   Number of sutures:  6 Repair type:    Repair type:  Simple Post-procedure details:    Dressing:  Open (no dressing)   Procedure completion:  Tolerated well, no immediate complications     Theotis Cameron HERO,  PA-C 04/12/24 1126    Levander Houston, MD 04/14/24 [phone]

## 2024-04-12 NOTE — ED Notes (Signed)
Pt verbalized understanding of discharge instructions. Pt ambulated from ed with steady gait.  

## 2024-04-12 NOTE — ED Notes (Signed)
 X-ray at bedside
